# Patient Record
Sex: Male | Born: 1965 | Race: Black or African American | Hispanic: No | Marital: Single | Smoking: Current every day smoker
Health system: Southern US, Community
[De-identification: ages and names within clinical notes are randomized; demographics above are authoritative.]

## PROBLEM LIST (undated history)

## (undated) DIAGNOSIS — I2510 Atherosclerotic heart disease of native coronary artery without angina pectoris: Secondary | ICD-10-CM

## (undated) DIAGNOSIS — I639 Cerebral infarction, unspecified: Secondary | ICD-10-CM

## (undated) DIAGNOSIS — R569 Unspecified convulsions: Secondary | ICD-10-CM

## (undated) HISTORY — PX: CORONARY ANGIOPLASTY WITH STENT PLACEMENT: SHX49

---

## 2002-04-28 ENCOUNTER — Emergency Department (HOSPITAL_COMMUNITY): Admission: EM | Admit: 2002-04-28 | Discharge: 2002-04-28 | Payer: Self-pay | Admitting: Emergency Medicine

## 2002-04-28 ENCOUNTER — Encounter: Payer: Self-pay | Admitting: Emergency Medicine

## 2002-05-09 ENCOUNTER — Emergency Department (HOSPITAL_COMMUNITY): Admission: EM | Admit: 2002-05-09 | Discharge: 2002-05-10 | Payer: Self-pay | Admitting: Emergency Medicine

## 2002-05-10 ENCOUNTER — Encounter: Payer: Self-pay | Admitting: Emergency Medicine

## 2002-07-18 ENCOUNTER — Emergency Department (HOSPITAL_COMMUNITY): Admission: EM | Admit: 2002-07-18 | Discharge: 2002-07-19 | Payer: Self-pay | Admitting: Emergency Medicine

## 2002-07-18 ENCOUNTER — Encounter: Payer: Self-pay | Admitting: Emergency Medicine

## 2002-08-02 ENCOUNTER — Encounter: Admission: RE | Admit: 2002-08-02 | Discharge: 2002-08-02 | Payer: Self-pay | Admitting: Nephrology

## 2002-08-02 ENCOUNTER — Encounter: Payer: Self-pay | Admitting: Nephrology

## 2002-08-02 ENCOUNTER — Encounter: Payer: Self-pay | Admitting: Surgery

## 2002-08-02 ENCOUNTER — Encounter: Admission: RE | Admit: 2002-08-02 | Discharge: 2002-08-02 | Payer: Self-pay | Admitting: Surgery

## 2002-09-11 ENCOUNTER — Emergency Department (HOSPITAL_COMMUNITY): Admission: EM | Admit: 2002-09-11 | Discharge: 2002-09-12 | Payer: Self-pay | Admitting: Emergency Medicine

## 2002-09-12 ENCOUNTER — Encounter: Payer: Self-pay | Admitting: Emergency Medicine

## 2002-11-09 ENCOUNTER — Inpatient Hospital Stay (HOSPITAL_COMMUNITY): Admission: EM | Admit: 2002-11-09 | Discharge: 2002-11-12 | Payer: Self-pay | Admitting: Psychiatry

## 2004-11-18 ENCOUNTER — Emergency Department (HOSPITAL_COMMUNITY): Admission: EM | Admit: 2004-11-18 | Discharge: 2004-11-18 | Payer: Self-pay | Admitting: Emergency Medicine

## 2004-12-01 ENCOUNTER — Inpatient Hospital Stay (HOSPITAL_COMMUNITY): Admission: EM | Admit: 2004-12-01 | Discharge: 2004-12-02 | Payer: Self-pay | Admitting: Emergency Medicine

## 2005-01-21 ENCOUNTER — Emergency Department (HOSPITAL_COMMUNITY): Admission: EM | Admit: 2005-01-21 | Discharge: 2005-01-21 | Payer: Self-pay | Admitting: Emergency Medicine

## 2005-05-26 ENCOUNTER — Ambulatory Visit: Payer: Self-pay | Admitting: Psychiatry

## 2005-05-26 ENCOUNTER — Inpatient Hospital Stay (HOSPITAL_COMMUNITY): Admission: AD | Admit: 2005-05-26 | Discharge: 2005-06-01 | Payer: Self-pay | Admitting: Psychiatry

## 2006-02-17 ENCOUNTER — Emergency Department (HOSPITAL_COMMUNITY): Admission: EM | Admit: 2006-02-17 | Discharge: 2006-02-17 | Payer: Self-pay | Admitting: Emergency Medicine

## 2006-02-23 ENCOUNTER — Emergency Department (HOSPITAL_COMMUNITY): Admission: EM | Admit: 2006-02-23 | Discharge: 2006-02-23 | Payer: Self-pay | Admitting: Emergency Medicine

## 2006-03-11 ENCOUNTER — Inpatient Hospital Stay (HOSPITAL_COMMUNITY): Admission: EM | Admit: 2006-03-11 | Discharge: 2006-03-20 | Payer: Self-pay | Admitting: Emergency Medicine

## 2006-03-11 ENCOUNTER — Ambulatory Visit: Payer: Self-pay | Admitting: Infectious Diseases

## 2006-10-06 ENCOUNTER — Ambulatory Visit (HOSPITAL_COMMUNITY): Admission: RE | Admit: 2006-10-06 | Discharge: 2006-10-06 | Payer: Self-pay | Admitting: General Practice

## 2007-04-21 ENCOUNTER — Other Ambulatory Visit: Payer: Self-pay | Admitting: Emergency Medicine

## 2007-04-22 ENCOUNTER — Inpatient Hospital Stay (HOSPITAL_COMMUNITY): Admission: AD | Admit: 2007-04-22 | Discharge: 2007-04-25 | Payer: Self-pay | Admitting: Psychiatry

## 2007-04-24 ENCOUNTER — Ambulatory Visit: Payer: Self-pay | Admitting: Psychiatry

## 2007-11-03 ENCOUNTER — Emergency Department (HOSPITAL_COMMUNITY): Admission: EM | Admit: 2007-11-03 | Discharge: 2007-11-04 | Payer: Self-pay | Admitting: Emergency Medicine

## 2008-04-25 IMAGING — CR DG NASAL BONES 3+V
4 series · 4 of 4 positions shown · non-contrast
Comparison: none

CLINICAL DATA: Fell.  
 NASAL BONES ? 3 VIEW:

[w waters]
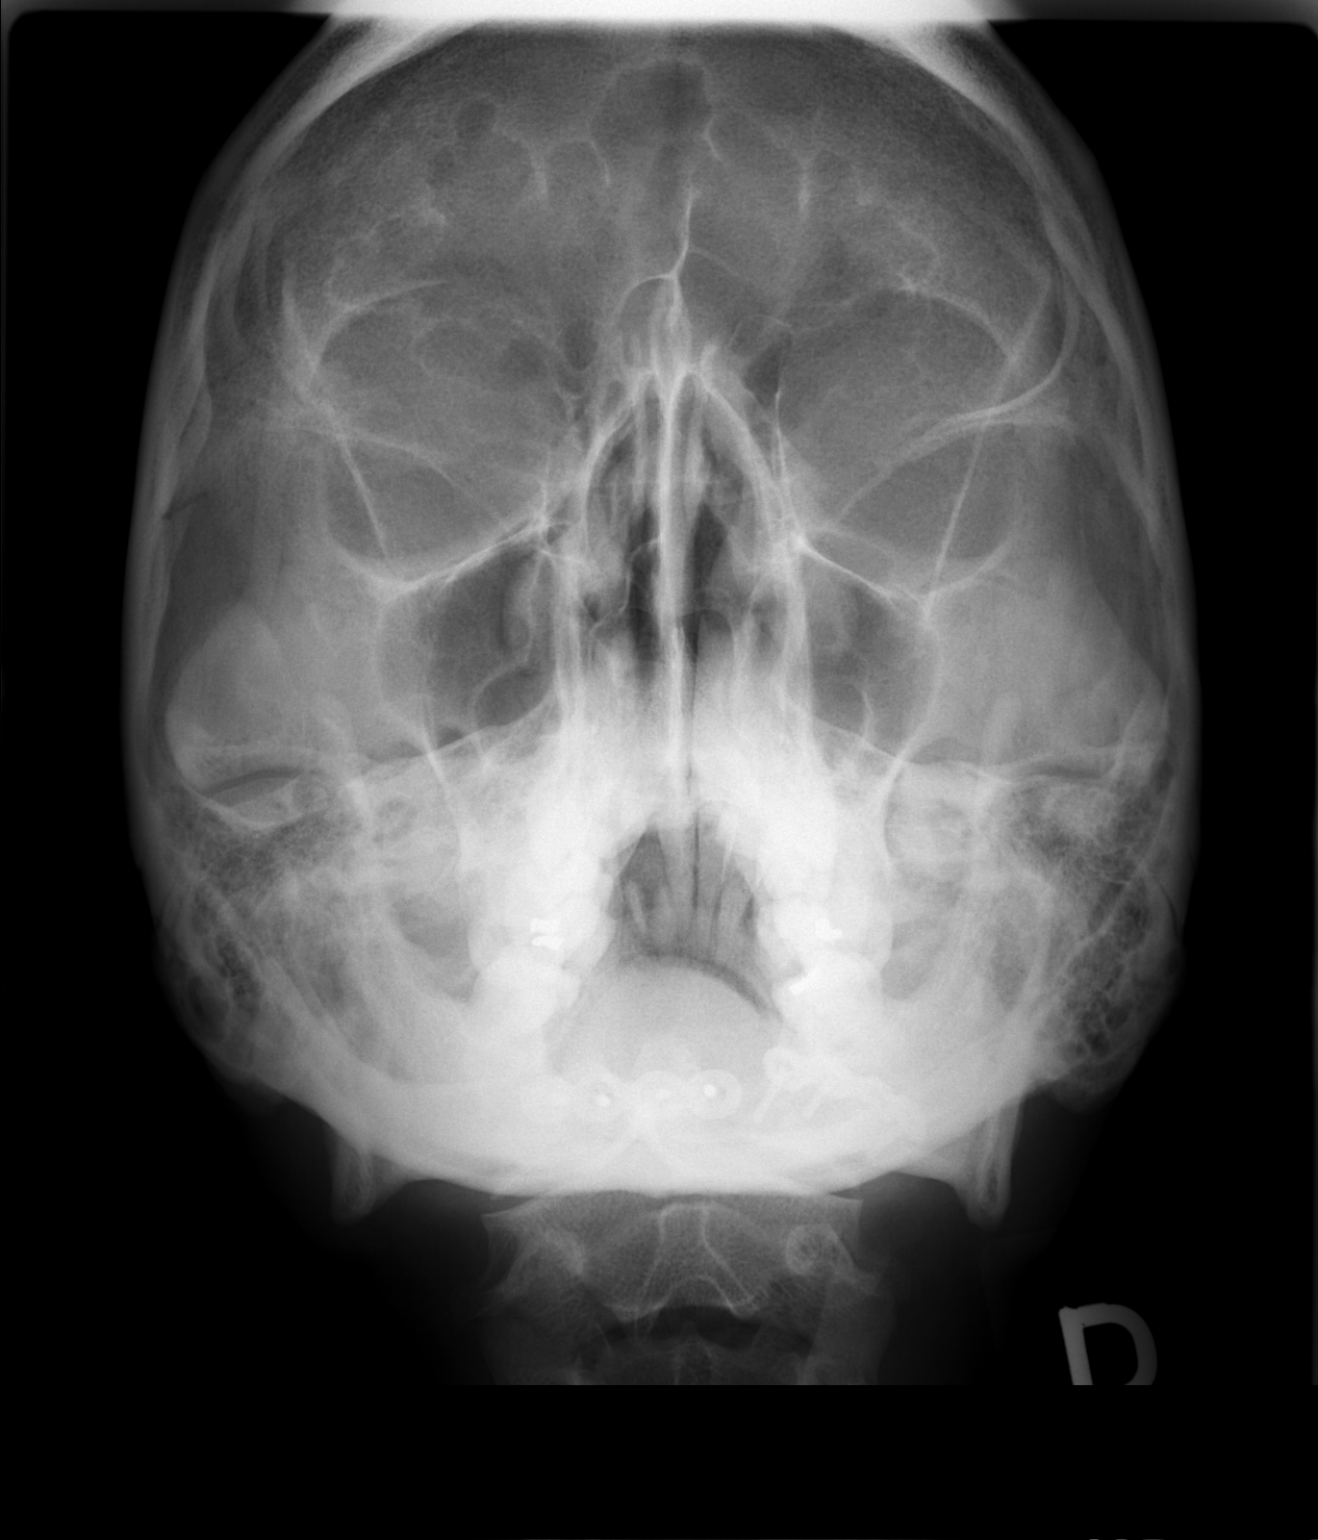

[w nasal bone lat * (1 of 3)]
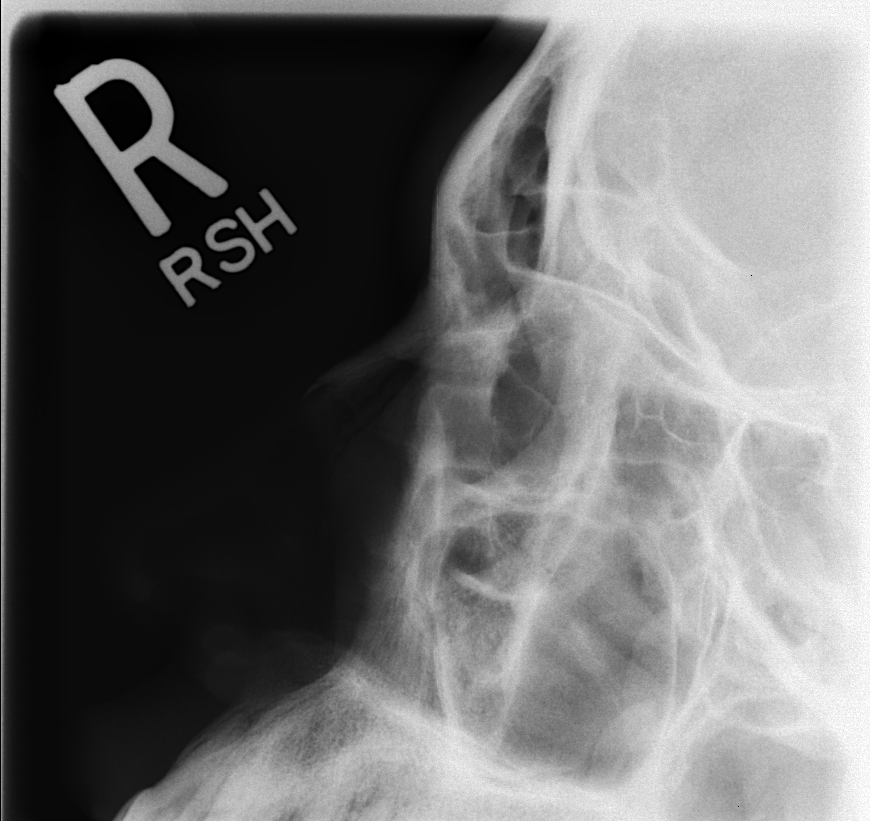

[w nasal bone lat * (2 of 3)]
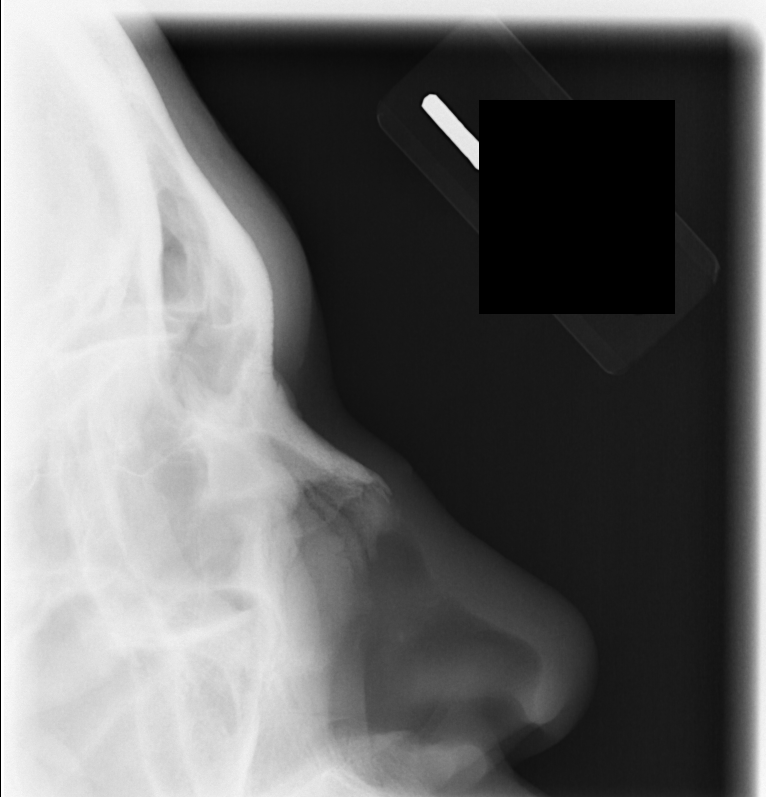

[w nasal bone lat * (3 of 3)]
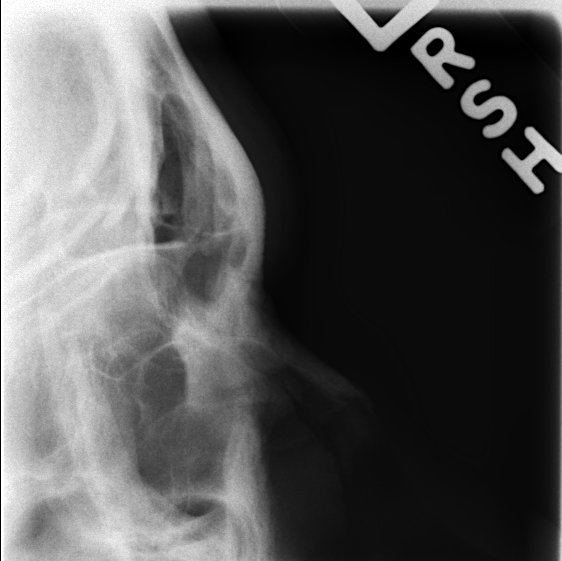

[4 of 4 positions shown; findings below may reference images not displayed]

FINDINGS: No definite nasal bone fractures are seen.  Visualized paranasal sinuses are clear.
IMPRESSION: No definite nasal bone fractures.

## 2008-05-17 IMAGING — CT CT ANGIO CHEST
2 of 4 series · 19 of 36 positions shown · IV contrast (APPLIED)
Comparison: Plain radiographs this same day.

CLINICAL DATA: Chest pain.
 CT ANGIOGRAPHY OF CHEST:
TECHNIQUE: Multidetector CT imaging of the chest was performed during bolus injection of intravenous contrast.  Multiplanar CT angiographic image reconstructions were generated to evaluate the vascular anatomy.
 Contrast:  100 cc Omnipaque 300.

[Series 5: pulm embolism 2.0 st · axial · 0.64mm/px · z∈[+1042,+1402]mm · 16 of 196 slices shown]
[im 8/196  lung]
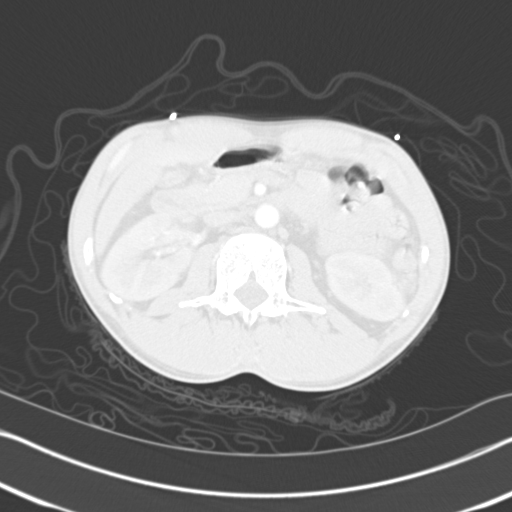
[im 24/196  mediastinal]
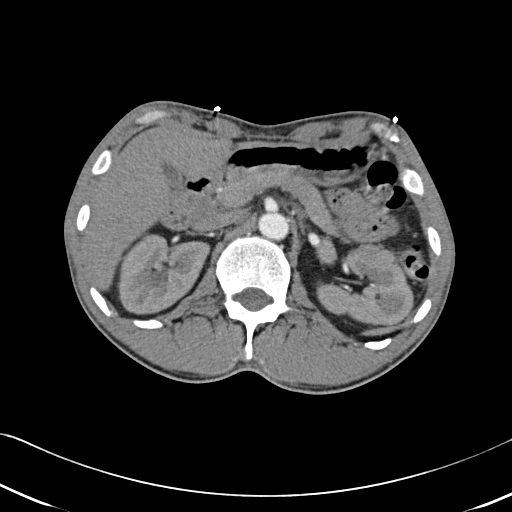
[im 32/196  lung]
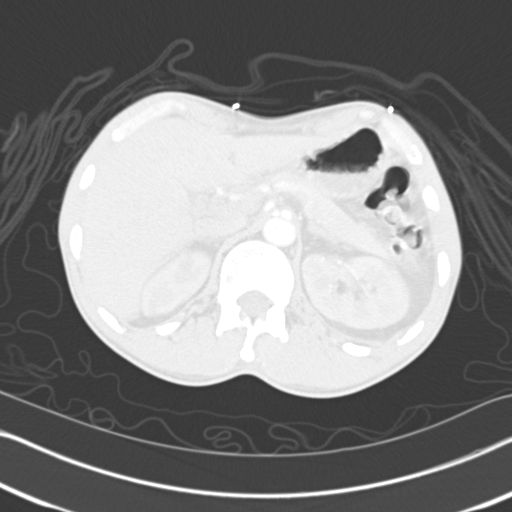
[im 47/196  mediastinal]
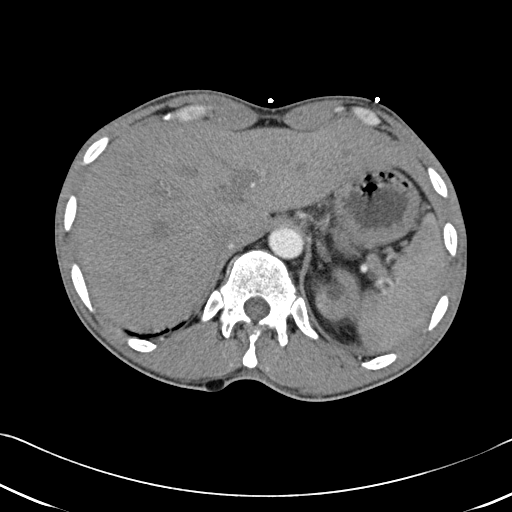
[im 55/196  lung]
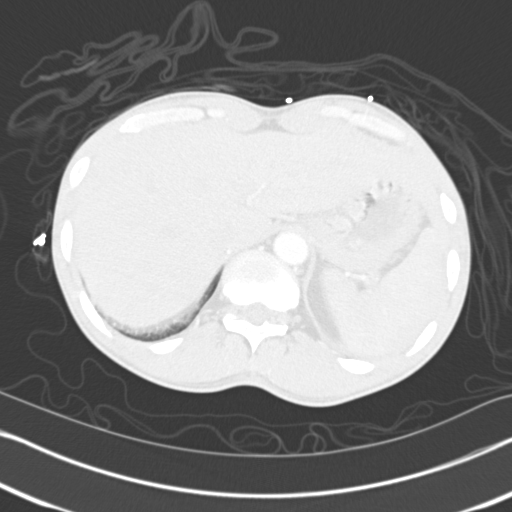
[im 71/196  mediastinal]
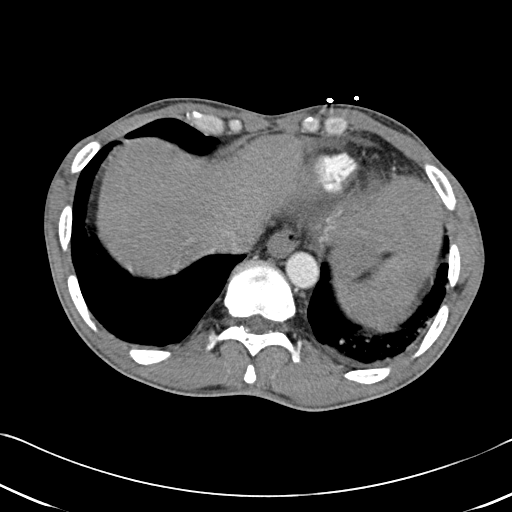
[im 79/196  lung]
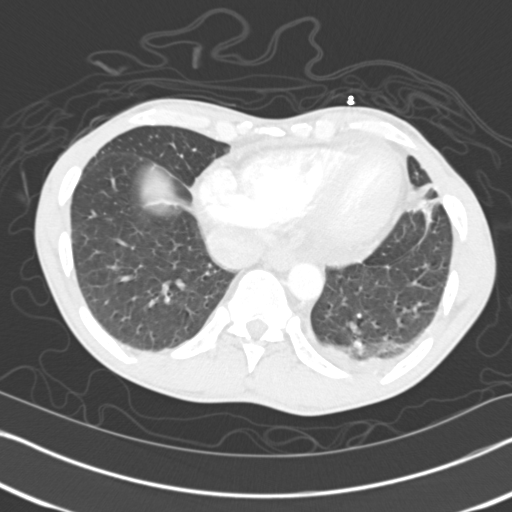
[im 94/196  mediastinal]
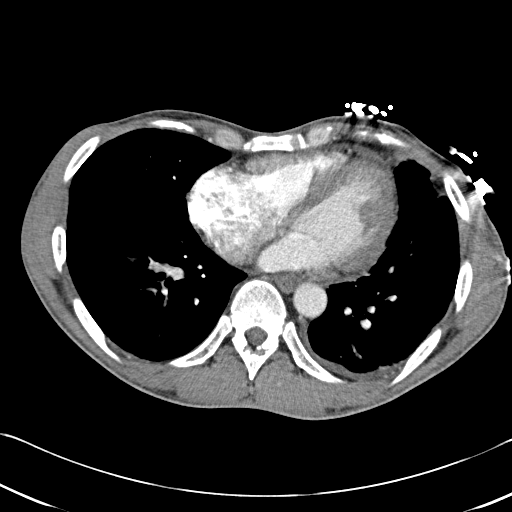
[im 102/196  lung]
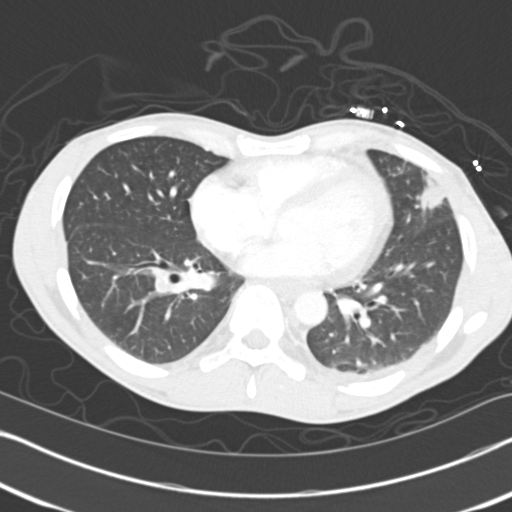
[im 118/196  mediastinal]
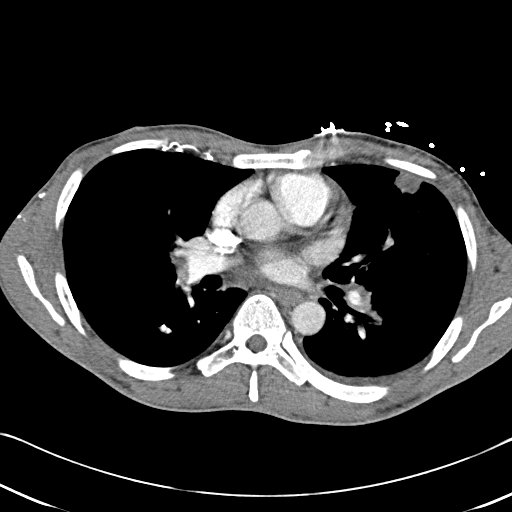
[im 125/196  lung]
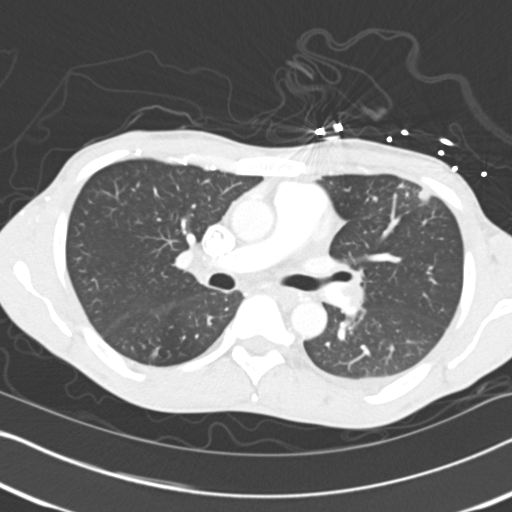
[im 141/196  mediastinal]
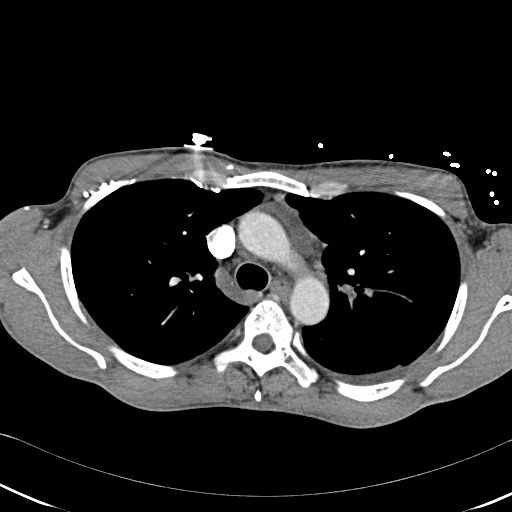
[im 149/196  lung]
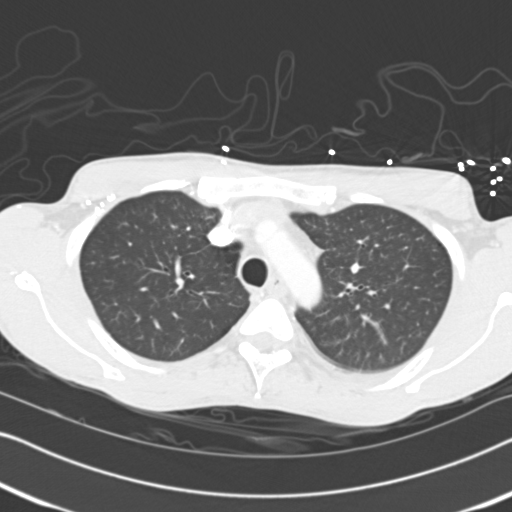
[im 164/196  mediastinal]
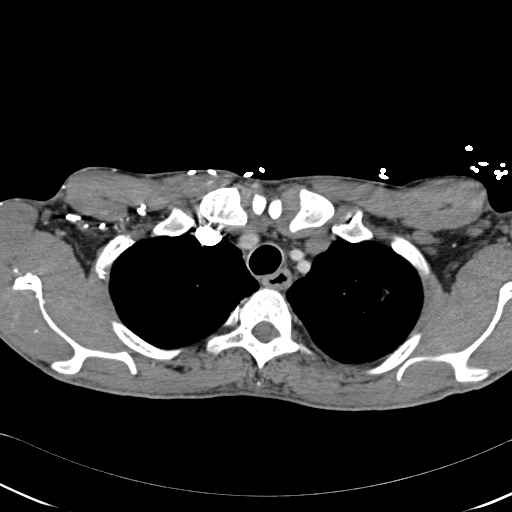
[im 172/196  lung]
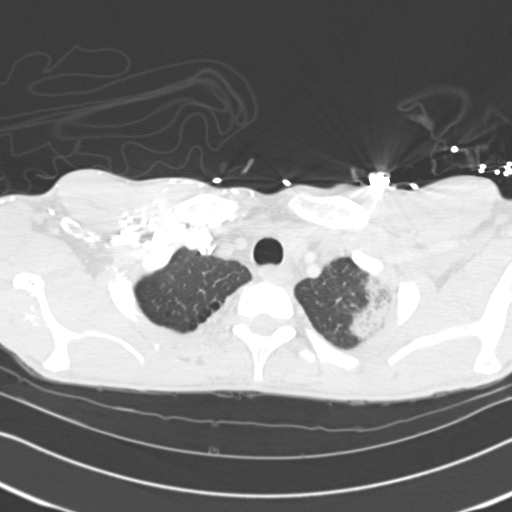
[im 188/196  mediastinal]
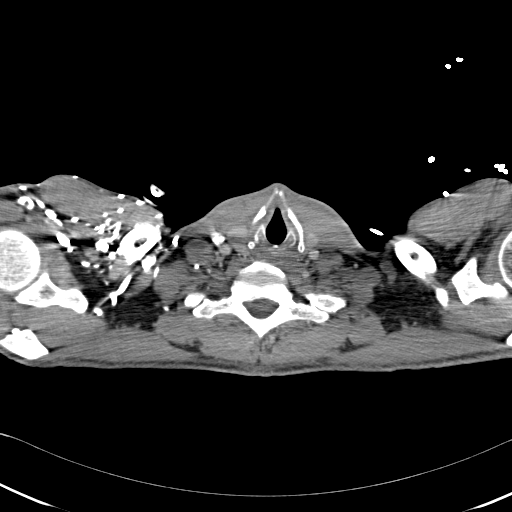

[Series 8: pulm embolism 2.0 cor · coronal · 0.76mm/px · 3 of 99 slices shown]
[im 20/99  mediastinal]
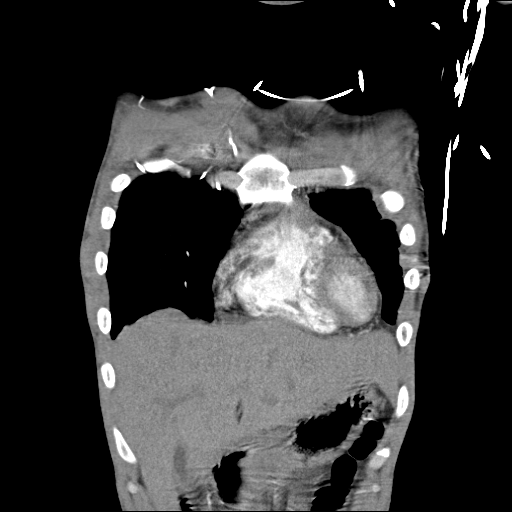
[im 40/99  mediastinal]
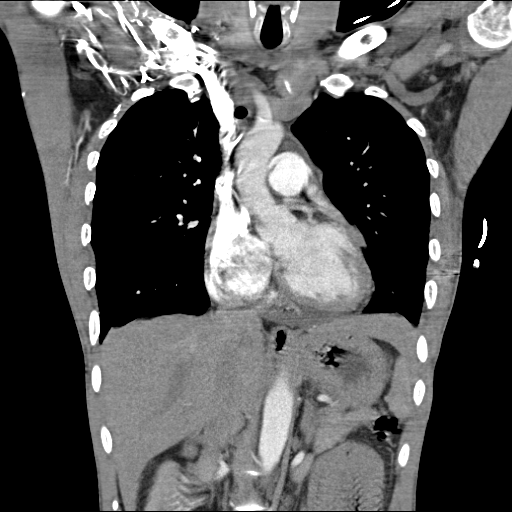
[im 59/99  mediastinal]
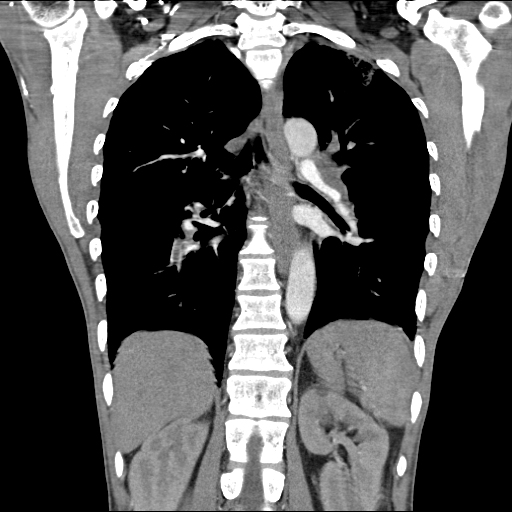

[19 of 36 positions shown; findings below may reference images not displayed]

FINDINGS: The patient has a large clot in the left main pulmonary artery with clot also identified in the right descending interlobar pulmonary artery.   The heart size is normal.   The patient has a small left pleural effusion.   There is a moderate pericardial effusion.   No hila, mediastinal, or axillary lymphadenopathy.   There is patchy airspace disease in the left upper lobe.   Densely calcified granuloma is seen in the superior segment of the right lower lobe.   There is also a pulmonary nodule measuring 2.0 x 2.0 cm in the left upper lobe.   In the lingula, a more triangular-shaped peripheral opacity is seen measuring 1.8 x 1.8 cm.   Lungs otherwise demonstrate some minimal dependent atelectatic change in the left base.   Incidentally imaged upper abdomen is unremarkable.   No focal bony abnormality.
IMPRESSION: 1.  This study is positive for pulmonary embolus with a fairly large clot burden as above. 
 2.  Nodular and peripheral opacities in the left lung as detailed above.  These may represent pulmonary infarcts, but neoplasm cannot be excluded, particularly in the left upper lobe lesion seen on image #77.  
 3.  Left apical airspace disease which may represent tuberculosis in this patient with a history of TB.
 4.  Small to moderate pericardial effusion.

## 2010-06-14 ENCOUNTER — Encounter: Payer: Self-pay | Admitting: Pulmonary Disease

## 2010-06-14 ENCOUNTER — Encounter: Payer: Self-pay | Admitting: General Practice

## 2010-09-10 ENCOUNTER — Emergency Department (HOSPITAL_COMMUNITY)
Admission: EM | Admit: 2010-09-10 | Discharge: 2010-09-10 | Disposition: A | Discharge status: Home / Self Care | Payer: Medicaid Other | Attending: Emergency Medicine | Admitting: Emergency Medicine

## 2010-09-10 DIAGNOSIS — M545 Low back pain, unspecified: Secondary | ICD-10-CM | POA: Insufficient documentation

## 2010-09-10 DIAGNOSIS — G40909 Epilepsy, unspecified, not intractable, without status epilepticus: Secondary | ICD-10-CM | POA: Insufficient documentation

## 2010-09-10 DIAGNOSIS — Z8679 Personal history of other diseases of the circulatory system: Secondary | ICD-10-CM | POA: Insufficient documentation

## 2010-10-06 NOTE — Discharge Summary (Signed)
**Note Patrick Marks** Patrick Marks, DUGO NO.:  [id number]   MEDICAL RECORD NO.:  [id number]          PATIENT TYPE:  IPS   LOCATION:  0406                          FACILITY:  BH   PHYSICIAN:  Anselm Jungling, MD  DATE OF BIRTH:  06/04/1965   DATE OF ADMISSION:  04/22/2007  DATE OF DISCHARGE:  04/25/2007                               DISCHARGE SUMMARY   IDENTIFYING DATA AND REASON FOR ADMISSION:  This was an inpatient  psychiatric admission for An[place], a 45 year old single African-American  male who was been living at the [facility].  He presented to the  emergency room at [facility] after calling 9-1-1.  He reported  that he had been smoking crack cocaine and began having suicidal  ideation.  Please refer to the admission note for further details  pertaining to the symptoms, circumstances and history that led to his  hospitalization.  He was given an initial Axis I diagnosis of cocaine  abuse, history of schizophrenia, and mood disorder.   MEDICAL AND LABORATORY:  The patient came to [place] with a history of  seizure disorder, and coagulopathy.  He was continued on Dilantin and  Coumadin.  These were followed closely by the nurse practitioner and the  pharmacist during his inpatient stay.  There were no acute medical  issues.   HOSPITAL COURSE:  The patient was admitted to the adult inpatient  psychiatric service.  He presented as a slender, but adequately  nourished African-American male who is generally pleasant and  cooperative.  He reported his mood being depressed, and his affect was  slightly flat.  His thought processes were clear, rational and goal  oriented, and there were no indications of psychosis.  He indicated  passive suicidal ideation.  He denied auditory or visual hallucinations.   He was involved in the therapeutic milieu and treated with a  psychotropic regimen that included Cymbalta 60 mg daily, Prozac 20 mg  daily, trazodone 100 mg at bedtime,  and, to address cocaine cravings,  amantadine 100 mg b.i.d..  He apparently had been treated with Haldol no  past, but there did not appear to be any compelling indication for him  to be on an antipsychotic at this time, therefore none was used.   The patient was a reasonably good participant in the treatment program.  By the fourth hospital day, eating and sleeping were stable, he appeared  absent any overt signs or symptoms of psychosis, and appeared  appropriate for discharge.   AFTERCARE:  The patient was to return to the [facility], and to  continue with his usual outpatient providers for psychiatric medication  and support.   DISCHARGE MEDICATIONS:  1. Cymbalta 60 mg daily.  2. Prozac 20 mg daily.  3. Trazodone 100 mg q.h.s.  4. Dilantin 300 mg, 3 tablets at bedtime.  5. Amantadine 100 mg b.i.d.  6. Coumadin 7.5 mg daily.   The patient was instructed to have PT/INR lab redrawn no later than  Monday, December 8.  He was instructed to follow up with his usual  medical Vincie Linn  regarding his anticoagulation therapy.  He was  instructed to not take Haldol any further.   DISCHARGE DIAGNOSES:  Axis I:  Cocaine dependence, early remission, and  history of substance-induced psychosis, mood disorder, not otherwise  specified.  Axis II:  Deferred.  Axis III:  History of seizure disorder, coagulopathy.  Axis IV:  Stressors severe.  Axis V:  Global Assessment of Functioning on discharge 55.      Anselm Jungling, MD  Electronically Signed     SPB/MEDQ  D:  04/25/2007  T:  04/25/2007  Job:  [id number] [id number]

## 2010-10-06 NOTE — H&P (Signed)
Patrick Marks NO.:  [id number]   MEDICAL RECORD NO.:  [id number]          PATIENT TYPE:  IPS   LOCATION:  0407                          FACILITY:  BH   PHYSICIAN:  Anselm Jungling, MD  DATE OF BIRTH:  1965-10-30   DATE OF ADMISSION:  04/22/2007  DATE OF DISCHARGE:                       PSYCHIATRIC ADMISSION ASSESSMENT   IDENTIFYING INFORMATION:  This is a 45 year old single African American  male.  He presented to the emergency room at [facility] last  night.  He had called 9-1-1.  He reported that he had been smoking on  crack; he had smoked 2 rocks and had begun having suicidal ideation.  Apparently he had called 9-1-1 and asked for help.  He states that he  has depression and crack abuse.  He is unable to kick using crack; he  states he wants to overdose on crack and he states that he has been in  rehab and it does not help.  He states he has had several attempts  before and cannot contract for safety.  He requested detox and he states  that drink helps to increase chances of suicide.   PAST PSYCHIATRIC HISTORY:  He has only has 1 other inpatient stay in  2002 at [facility]; he was an inpatient for suicidal  ideation and substance abuse.   SOCIAL HISTORY:  He states he went to the tenth grade.  He lived in a  group home since age 12.   FAMILY HISTORY:  He denies.   ALCOHOL AND DRUG HISTORY:  He began using alcohol around age 68.  He  drinks 1 40 ounce beer a week and crack he began using at age 72.  He  uses $ 20.00 bag daily he says.  His primary care Jennings Stirling is a Dr.  Estrella Myrtle main office in [place],  but he comes to the home and he is  followed by [facility], Dr. Hortencia Pilar.   MEDICAL PROBLEMS:  He is known to have a seizure disorder.  He is status  post a pulmonary embolus and treated with Coumadin.  He is also status  post a CVA in 2002.   MEDICATIONS:  He is currently prescribed Prozac 20  mg p.o. daily,  trazodone 100 mg at bedtime, Dilantin 300 mg at bedtime, warfarin 7.5 mg  p.o. daily, his next Prolixin level is due a week from Tuesday and he is  followed at the [facility] for that.   ALLERGIES:  PENICILLIN GIVES HIM A RASH.   POSITIVE PHYSICAL FINDINGS:  He was medically cleared in the ED at the  hospital last night and his UDS was positive for cocaine.  He had no  alcohol on board.  His labs showed that his SGOT was slightly elevated  at 50 and he had no other worrisome laboratory findings.   PHYSICAL EXAMINATION:  VITAL SIGNS:  His vital signs on admission to our  unit show that he is 72 inches tall.  He weighs 134, temperature is  96.9, blood pressure was 127/80 and his pulse was 59,  respirations are  20.  He cannot remember how long it has been he has actually had a  seizure.  EXTREMITIES:  He has partial contracting of his right hand.  This is  felt to be due to a CVA back in 2002.  SKIN:  He has several what appear to be benign cysts, little lumps.  Please see anatomic drawing to look at this.  The remainder of his physical exam was unremarkable.   MENTAL STATUS EXAM:  He is alert and oriented times 3.  He is  appropriately groomed, dressed and nourished.  His speech has an  appropriate rate, rhythm and tone.  His mood he reports being depressed.  His affect is slightly flat.  His thought processes are clear, rational  and goal oriented.  He wants to get off the crack.  Judgment and insight  are fair.  Concentration and memory are fair.  Intelligence is average.  He is passively suicidal.  He is not sure he would be safe if he was  discharged at this point in time.  He denies being homicidal.  He denies  auditory or visual hallucinations.   DIAGNOSES:  AXIS I:  Diagnosis is cocaine abuse versus dependence,  schizophrenia, depressive disorder not otherwise specified.  AXIS II:  Deferred.  AXIS III:  Seizure disorder.  Pulmonary embolism  treated with Coumadin  and status post cerebrovascular accident in 2002.  AXIS IV:  Burden of illness.  AXIS V:  30.   PLAN:  The plan is to admit for safety and stabilization.  We will  adjust his medication.  Toward that end, we added some Cymbalta to help  with his depression and anxiety.  His estimated length of stay is 3 to 4  days.  He will resume treatment at the [facility]  under the direction of Dr. Hortencia Pilar.      Mickie Leonarda Salon, P.A.-C.      Anselm Jungling, MD  Electronically Signed    MD/MEDQ  D:  04/22/2007  T:  04/22/2007  Job:  [id number]

## 2010-10-09 NOTE — Discharge Summary (Signed)
Patrick Marks, Patrick Marks NO.:  [id number]   MEDICAL RECORD NO.:  [id number]                   PATIENT TYPE:  IPS   LOCATION:  0306                                 FACILITY:  BH   PHYSICIAN:  Jeanice Lim, M.D.              DATE OF BIRTH:  [birth year]   DATE OF ADMISSION:  11/09/2002  DATE OF DISCHARGE:  11/12/2002                                 DISCHARGE SUMMARY   IDENTIFYING DATA:  A 45 year old African-American male, single, voluntarily  admitted with a history of cocaine abuse, schizophrenia, borderline, and  relapsing on cocaine, having thoughts of killing himself, ashamed of cocaine  use.   MEDICATIONS:  1. Risperdal IM, last dose on November 08, 2002.  2. Ativan 0.5 mg daily.  3. Prozac daily.  4. Dilantin 100 mg b.i.d. and 200 mg q.h.s.   DRUG ALLERGIES:  To PENICILLIN.   PHYSICAL EXAMINATION:  GENERAL:  Essentially within normal limits.  NEUROLOGICAL:  Nonfocal.   LABORATORY DATA:  Routine admission labs essentially within normal limits.   MENTAL STATUS EXAM:  A tall, slim, alert male, sociable, appropriate.  Speech within normal limits; no pressure.  Mood euthymic.  Affect full.  Thought process goal directed.  Thought content appropriate; no acute  dangerous ideation or psychotic symptoms.  Cognitively intact.  Judgment and  insight fair.   ADMISSION DIAGNOSES:   AXIS I:  1. Schizophrenia, not otherwise specified.  2. Cocaine abuse.   AXIS II:  Deferred.   AXIS III:  History of a seizure disorder.   AXIS IV:  Moderate stress related to group home and other psychosocial  issues.   AXIS V:  30/55.   HOSPITAL COURSE:  Patient was admitted, ordered routine p.r.n. medications,  underwent further monitoring, was encouraged to participate in the  individual, group, and milieu therapy.  Patient had right-sided weakness  from a stroke three years ago and a history of a seizure disorder.  He was  monitored on seizure precautions,  and patient claimed mild withdrawal  symptoms, reported improving mood as he was stabilized.   CONDITION AT DISCHARGE:  His condition at discharge was markedly improved.  Mood was more euthymic, affect brighter, thought process goal directed,  thought content negative for dangerous ideation or psychotic symptoms, there  were no suicidal thoughts, and the patient reported motivation to be  compliant with the after-care plan.   DISCHARGE MEDICATIONS:  1. Risperdal 0.25 mg IM every 14 days.  2. Dilantin 100 mg two q.h.s.  3. Ativan 0.5 mg t.i.d.  4. Prevacid 20 mg q.a.m.  5. Trazodone 100 mg q.h.s.   DISCHARGE DIAGNOSES:   AXIS I:  1. Schizophrenia, not otherwise specified.  2. Cocaine abuse.   AXIS II:  Deferred.   AXIS III:  History of a seizure disorder.   AXIS IV:  Moderate stress related to group home and other psychosocial  issues.  AXIS V:  Global assessment of functioning on discharge was 55.   FOLLOW UP:  The patient is to follow up with [facility] on November 15, 2002, at 2:30 p.m.                                               Jeanice Lim, M.D.    JEM/MEDQ  D:  12/05/2002  T:  12/06/2002  Job:  [id number]
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
## 2010-10-09 NOTE — H&P (Signed)
NAMEDYON, ROTERT NO.:  [id number]   MEDICAL RECORD NO.:  [id number]                   PATIENT TYPE:  IPS   LOCATION:  0306                                 FACILITY:  BH   PHYSICIAN:  Jeanice Lim, M.D.              DATE OF BIRTH:  [birth year]   DATE OF ADMISSION:  11/09/2002  DATE OF DISCHARGE:  11/12/2002                         PSYCHIATRIC ADMISSION ASSESSMENT   IDENTIFYING INFORMATION:  This is a 45 year old African-American male who is  single.  This is a voluntary admission.   HISTORY OF PRESENT ILLNESS:  This patient, with a history of cocaine abuse,  schizophrenia and borderline intellect, apparently relapsed on cocaine and  was having thoughts of killing himself, being ashamed of his cocaine use.  He also reported that he had auditory hallucinations, during this past week,  but denies that he has had any today.  He has a past history of cocaine  abuse and some alcohol abuse in the past.  The patient lives in an assisted-  living family home and apparently had not developed any specific plan for  suicide.  He was feeling ashamed of himself for using cocaine, knew that it  was wrong and apparently had nephews and friends in the assisted-living  facility that had encouraged him to use the cocaine along with some of the  other residents.   PAST PSYCHIATRIC HISTORY:  The patient is followed at [facility].  This is his first admission to [facility].  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  He has a  history of admissions to [facility] in November of 2003 and that is  his only other admission.   SOCIAL HISTORY:  The patient is single.  He is originally from [place] and  came here in 2003 to be near his sister and her family.  He currently is  living in [facility] since October of 2003.  He has a history  of legal charges for panhandling for  cocaine money in the past but no  current charges.   FAMILY HISTORY:  Not available.   ALCOHOL/DRUG HISTORY:  The patient is currently abusing cocaine.  He denies  any other substance abuse.   PAST MEDICAL HISTORY:  The patient's primary care Tanairi Cypert for [facility] is not clear.  The patient has history of seizures.  He  denies any other medical problems.  He does normally drink Boost drink as a  nutritional supplement when he is at home.   MEDICATIONS:  Risperdal Consta IM, last dose 25 mg on November 08, 2002, Ativan  0.5 mg p.o. t.i.d., Prozac 20 mg q.d., Dilantin 100 mg b.i.d. and 20 mg  q.h.s.   ALLERGIES:  PENICILLIN.   POSITIVE PHYSICAL FINDINGS:  The patient physical examination was done in  the emergency room and is noted in the  record.  It was generally  unremarkable.  Today, we note this is a tall, slim, fully alert male with a  bright affect.   LABORATORY DATA:  Very slight mild anemia with hemoglobin of 11.5 on a  normal scale of 13-17, hematocrit 34.7 on a scale of 39-52, RDW 15.1,  platelets 194,000, MCV 80.5.  Other measures are normal.  His white blood  cell count is 5600.  The patient's electrolytes were normal as were his  liver enzymes.  Thyroid panel is currently pending as are his urinalysis and  urine drug screen.  His Dilantin level is 13.6 on a scale of 10-20 as  normal.  Alcohol level was less than 5.  Vital signs include temperature 97,  pulse 66, respirations 18, blood pressure 108/70.  He is 6 feet 3-1/2 inches  tall and weighs 140 pounds.  BMI 18.   MENTAL STATUS EXAM:  This is a tall, slim male who is fully alert with quite  a bright affect.  He is sociable and he is generally appropriate,  directible, emotionally available.  Quite interested in the interview.  Speech is within normal limits.  No evidence of pressure.  His mood is  euthymic.  It is clear that he has some considerable guilt about using the  cocaine.  Felt somewhat  hopeless and ashamed of himself but currently is  denying any overt suicidality.  Thought process is fairly logical.  He is  goal directed in his thoughts.  Wants to attend groups and do well.  No  evidence of hallucinations or other psychosis.  Cognitively, he is intact  and oriented x 3.  The patient asked pertinent and appropriate questions  throughout the conversation.  He is concerned about wanting to live  independently but his history does not really support his ability to do so.  He has got a lot of questions about that today and wants to know if he can  get some help to move out on his own.   DIAGNOSES:   AXIS I:  1. Depression not otherwise specified.  2. Schizophrenia not otherwise specified by history.  3. Cocaine abuse.   AXIS II:  Deferred.   AXIS III:  Seizure disorder not otherwise specified.   AXIS IV:  Moderate (stress at the group home).   AXIS V:  Current 39; past year 28.   PLAN:  Voluntarily admit the patient with 15-minute checks in place.  We are  going to continue his current medications and evaluate his mood and  thinking.  We are going to place him on dual-diagnosis programming and have  provided him with Librium 25 mg q.6h. p.r.n. for withdrawal symptoms and the  Haldol 5 mg IM p.o. q.4h. p.r.n. agitation.  Will continue his routine dose  of Risperdal as needed.  We are going to ask the casemanager to evaluate his  family support and the situation out there at [facility] and  we will see how he progresses.   ESTIMATED LENGTH OF STAY:  Five days.     Margaret A. Stephannie Peters                   Jeanice Lim, M.D.    MAS/MEDQ  D:  12/03/2002  T:  12/03/2002  Job:  [id number]

## 2010-10-09 NOTE — Discharge Summary (Signed)
Patrick Marks, ALEGRIA NO.:  [id number]   MEDICAL RECORD NO.:  [id number]          PATIENT TYPE:  INP   LOCATION:  3714                         FACILITY:  [facility]   PHYSICIAN:  Jackie Plum, M.D.DATE OF BIRTH:  14-Jun-1965   DATE OF ADMISSION:  03/11/2006  DATE OF DISCHARGE:                                 DISCHARGE SUMMARY   Anticipated date of discharge is pending, possibly tomorrow.   DISCHARGE DIAGNOSES:  1. Pulmonary embolism.      a.     CT scan angiogram done on March 11, 2006 indicated a pulmonary       embolism with very large clot burden. The patient was started on       Coumadin anticoagulation, INR 2.2 today.  2. History of tuberculosis.  3. History of schizophrenia.  4. History of seizure disorder.  5. History of allergy to penicillin.  6. History of cigarette smoking.  7. History of psychotic disorder, not otherwise specified (the patient is      to follow up at [facility] on discharge).   CONSULTANTS:  Infectious disease and psychiatry.   PROCEDURE:  Not applicable.   CONDITION ON DISCHARGE:  Final condition on discharge will be dictated at  time of final discharge from the hospital.   DISCHARGE MEDICATIONS:  Final medications at the time of discharge will be  dictated at the time of discharge by discharging physician.   REASON FOR ADMISSION:  Pulmonary embolism. The patient presented with chest  pain. On presentation, x-ray of the chest was done which showed emphysema  without acute disease. CT scan angiography ruled in for pulmonary embolism,  and therefore, the patient was admitted for further evaluation and treatment  in this regard. For full details regarding patient's presenting signs,  symptoms and lab work at time of admission, please review the H&P dictated  by Dr. Suanne Marker on March 11, 2006.   On admission, the patient was placed on a telemetry bed.  There were no  significant arrhythmias. He was  started on anticoagulation with monitoring  of his hematologic indices. The patient continued to remain fairly stable  throughout the stay. On account of his history of TB and findings on CT  scan, infectious disease was consulted __________ as to whether to continue  him on antituberculosis medication regimen. However, after further  evaluation of sputum specimen __________ and after discussions with the  health department, it was found out that patient had nearly completed his  INH. It was also of much concern regarding further INH an increase in his  INR, being started on anticoagulation it was felt that there was no need for  further treatment since he had almost completed his medication regimen. The  patient was told not to be actively infected with tuberculosis and his  isolation status was discontinued. He has continued to do well, and he is  awaiting discharge from hospital pending adequate anticoagulation.   On rounds today, the patient denies any fevers or chills, shortness of  breath. No chest pain.   His vital signs were stable. His  BP was 108/70, pulse 77, temperature 96.9  degrees Fahrenheit, O2 __________ % on room air.  His cardiopulmonary  auscultation reveals diminished breath sounds __________ likely improved  __________.  __________ no gallops or murmur.  Abdomen was soft, nontender,  bowel sounds present. Extremities:  No cyanosis.   His lab work indicated PT of 25.6, INR of 2.2. The patient's last chemistry  indicated sodium of 135, potassium 4.0, chloride 106, CO2 23, glucose 81,  BUN 12, creatinine 0.7, calcium 9.2. His last WBC count was 5.4, hemoglobin  11.6, hematocrit 34.8, MCV 82.5, platelet count 263.   This patient will be discharged tomorrow should his INR be therapeutic. At  the time of discharge, his final discharge medications will be dictated as  an addendum by the discharging physician __________ tomorrow.      Jackie Plum, M.D.   Electronically Signed     GO/MEDQ  D:  03/18/2006  T:  03/19/2006  Job:  [id number]

## 2010-10-09 NOTE — Discharge Summary (Signed)
NAMEGAREN, Patrick Marks               ACCOUNT NO.:  [id number]   MEDICAL RECORD NO.:  [id number]          PATIENT TYPE:  INP   LOCATION:  3714                         FACILITY:  [facility]   PHYSICIAN:  Kela Millin, M.D.DATE OF BIRTH:  July 29, 1965   DATE OF ADMISSION:  03/11/2006  DATE OF DISCHARGE:  03/20/2006                                 DISCHARGE SUMMARY   ADDENDUM:  This is an addendum to the discharge summary dictated on March 18, 2006, by Dr. Julio Sicks.  This is just to indicate the discharge  medications.   DISCHARGE MEDICATIONS:  1. Coumadin 7.5 mg p.o. q.h.s. (6 p.m.), the patient's PT/INR is to be      checked on March 22, 2006, and the results called to Dr. Petra Kuba      for the adjustment of the Coumadin dose as appropriate to keep his INR      between 2 and 3 for his pulmonary embolus.  2. Prozac 20 mg p.o. daily.  3. Dilantin 300 mg p.o. q.h.s.  4. Trazodone 100 mg p.o. q.h.s.  5. Prolixin 0.5 mL injection, specialized dosing, to be continued as prior      to admission.   Infectious disease recommended that the patient have serial CT scans of his  chest with contrast to follow up on the lung nodular lesions/opacities, and  the patient is to be scheduled for the first follow-up CT scan in 3 months  and as a result of this, to be called to Dr. Petra Kuba, and he is to  schedule the subsequent serial CT scans.  As indicated in the discharge  summary by Dr. Julio Sicks on October 26, the INH along with vitamin B6 were  discontinued by infectious disease while the patient was in the hospital.   FOLLOW-UP CARE:  The patient is to be scheduled for a follow-up appointment  with Dr. Petra Kuba in one week.   DISCHARGE CONDITION:  Improved/stable.  See the discharge summary of March 18, 2006, for full details of the patient's hospital stay.   ENCLOSURE:  Discharge summary of March 18, 2006, by Dr. Julio Sicks to  Mina Marble, M.D.      Kela Millin, M.D.  Electronically Signed     ACV/MEDQ  D:  03/20/2006  T:  03/20/2006  Job:  [id number]   cc:   Mina Marble, M.D.

## 2010-10-09 NOTE — Consult Note (Signed)
NAMELUDWIG, TUGWELL NO.:  [id number]   MEDICAL RECORD NO.:  [id number]          PATIENT TYPE:  INP   LOCATION:  3702                         FACILITY:  [facility]   PHYSICIAN:  Antonietta Breach, M.D.  DATE OF BIRTH:  1966/02/07   DATE OF CONSULTATION:  DATE OF DISCHARGE:                                   CONSULTATION   REASON FOR CONSULTATION:  Treatment of psychotic condition.   HISTORY OF PRESENT ILLNESS:  Patrick Marks is a 45 year old male admitted to  the [facility] on the 19th of October, 2007, due to chest  pain.  He has a diagnosis of schizophrenia and resides in a group home.  He  was diagnosed with a pulmonary embolism and a left upper lobe lesion was  also found.  The patient has been cooperative with care.  He does not have  any depressed mood or suicidal ideation.  He has no homicidal ideation.  He  is not currently having any auditory or visual hallucinations.  He has been  receiving Prolixin maintenance.   PAST PSYCHIATRIC HISTORY:  The patient has undergone psychiatric admission.  His most recent was in January of 2007 and before that, June of 2004.  He is  followed by the [facility].  He does have a  history of delusions, and he has had a history of suicidal ideation without  suicide attempts.  He also has a history of hallucinations.  His diagnosis  has been schizophrenia.  The patient has been treated with Prolixin  decanoate, and he receives this every 2 weeks at 25 mg intramuscular.  He  also has been treated with Risperdal in the past.   The patient does have a history of noncompliance with his treatment.   FAMILY PSYCHIATRIC HISTORY:  None known.   SOCIAL HISTORY:  The patient is single.  He has no children.  He has an 8th  grade education.  Occupationally, he is disabled.  He was originally born in  [place].  He resides in a group home.   Alcohol:  None.  The patient has used cocaine in the  past.   GENERAL MEDICAL PROBLEMS:  There is a history of TB, seizure disorder,  emphysema.  He has a history of cerebrovascular disease, with a CVA in 2002.   MEDICATIONS:  The MAR is reviewed.  The patient is on tuberculosis  medication.  He is treated with:  1. Trazodone 100 mg q.h.s.  2. Prozac 20 mg every day.  3. Prolixin 25 mg every 2 weeks intramuscular, decanoate.   ALLERGIES:  THE PATIENT IS ALLERGIC TO PENICILLIN.   LABORATORY DATA:  INR 1.2, CK MB 1.0.   REVIEW OF SYSTEMS:  CONSTITUTIONAL:  Afebrile.  HEAD:  No trauma.  EYES:  No  visual changes.  EARS:  No hearing impairment.  NOSE:  No rhinorrhea.  MOUTH/THROAT:  No sore throat.  NEUROLOGIC:  As above.  PSYCHIATRIC:  As  above.  CARDIOVASCULAR:  Mild chest pain.  RESPIRATORY:  No coughing or  wheezing.  GASTROINTESTINAL:  No nausea, vomiting,  or diarrhea.  GENITOURINARY:  No dysuria.  SKIN:  Unremarkable.  MUSCULOSKELETAL:  No  deformities.  HEMATOLOGIC/LYMPHATIC:  Unremarkable.  ENDOCRINE/METABOLIC:  Unremarkable.   EXAMINATION:  VITAL SIGNS:  Temperature 96.9, pulse 93, respiration 18,  blood pressure 88/52.  The O2 saturation on room air is 93%.   MENTAL STATUS EXAM:  Patrick Marks is alert.  He has appropriate eye contact.  His speech involves normal rate and prosody.  He does not have any psych or  motor agitation.  He is oriented to the year, the month, to the day, person  and place, as well as circumstances.  He has a disheveled appearance.  Mood:  I am okay.  Affect:  Appropriate.  Thought process:  Logical, coherent,  goal directed.  No looseness of association.  Thought content:  No thoughts  of harming himself.  No thoughts of harming others.  No delusions.  No  hallucinations.  Memory:  3/3 immediate, 3/3 on recall.  Remote memory is  intact.  Insight is fair.  Judgment is intact.  Fund of knowledge and  intelligence:  Less than average.  The patient exhibits no involuntary  abnormal movements or  stiffness.   ASSESSMENT:   AXIS I:  293.81  Psychotic disorder not otherwise specified.  Rule out  schizophrenia, undifferentiated time, stable.  History of cocaine abuse.   AXIS II:  None.   AXIS III:  See general medical problems.   AXIS IV:  General medical.   AXIS V:  55.   Patrick Marks is not at risk to harm himself or others.   He agrees to use emergency services for any psychiatric emergency symptoms.   He does have a history of maintenance with Prolixin, and his psychotic  symptoms are in remission.  He does not appear to be having any side  effects; therefore, with his history of noncompliance, would recommend  continuing the Prolixin.   Would give his next Prolixin decanoate shot at 25 mg on the 30th of October.  Would also prescribe Prolixin 5 mg IM b.i.d. p.r.n. agitation.  Would also,  for antidepression maintenance, start Prozac 20 mg every day.   In case of extrapyramidal symptoms, would order Cogentin 2 mg IM b.i.d.  p.r.n.   RECOMMENDATIONS:  Regarding discharge planning, it is anticipated that he  will follow up with the [facility] and continue  his maintenance of psychiatric medication as above.     Antonietta Breach, M.D.  Electronically Signed    JW/MEDQ  D:  03/13/2006  T:  03/13/2006  Job:  [id number]

## 2010-10-09 NOTE — Discharge Summary (Signed)
NAMETODDRICK, SANNA NO.:  [id number]   MEDICAL RECORD NO.:  [id number]          PATIENT TYPE:  IPS   LOCATION:  0402                          FACILITY:  [facility]   PHYSICIAN:  Anselm Jungling, MD  DATE OF BIRTH:  27-Sep-1965   DATE OF ADMISSION:  05/26/2005  DATE OF DISCHARGE:  06/01/2005                                 DISCHARGE SUMMARY   IDENTIFYING DATA AND REASON FOR ADMISSION:  The patient is a 45 year old  single African-American male with schizoaffective disorder, seizures and  cocaine abuse, currently living in a group home, admitted on involuntary  petition secondary to increasing depression and suicidal ideation. Although  he has had psychotic symptoms in the past consisting of auditory  hallucinations, he denied auditory hallucinations at the time of admission.  He indicated that he was not currently taking his medication. Past  medication regimen had included Prolixin D injection q. 2 weeks and  Dilantin. The nature of his seizure disorder history was unclear at the time  of admission. This was not his first [facility] admission. Please refer to the  admission note for further details pertaining to the symptoms, circumstances  and history that led to his hospitalization. He was given an initial Axis I  diagnosis of schizoaffective disorder, depressed, and history of cocaine  abuse.   MEDICAL AND LABORATORY:  The patient was medically assessed by the  psychiatric nurse practitioner. There were no significant medical issues  during this brief inpatient psychiatric stay. Dilantin level was measured  and felt to be acceptable.   HOSPITAL COURSE:  The patient was admitted to the adult inpatient  psychiatric service. He was restarted on Prolixin 10 mg b.i.d., orally. This  was well tolerated. He was also continued on Dilantin 300 mg q.h.s.. Seizure  precautions were in place.   He presented as a tall, slender male who is pleasant and friendly, but very  quiet,  reserved, and soft spoken. He generally showed smiling, pleasant  affect, and tended to avoid verbalizing any specific complaints. He did  indicate on admission that he felt somewhat depressed. Otherwise, he seemed  to have few specific complaints during his hospital stay, usually responding  that he felt fine.  He was somewhat isolative, tended to spend a lot of  time in bed, which was partly interpreted as being deficient on sleep at the  time of his admission.   By the seventh hospital day, it was apparent that the patient was feeling  relatively well, indicating that he felt improved, denying any psychotic  symptoms, and probably at baseline level of functioning. At this time he was  felt appropriate for discharge.   AFTERCARE:  The patient was to be seen at [facility] on  June 03, 2005 for medication follow-up. The patient was given an  injection of Prolixin D 25 mg IM on May 29, 2005,  and his next injection  was due June 12, 2005. He was discontinued from oral Prolixin prior to  discharge. Dilantin 300 mg q.h.s. was continued upon discharge.   DISCHARGE DIAGNOSES:  AXIS I:  Schizophrenia, chronic, with acute  exacerbation, resolving.  AXIS II: Deferred.  AXIS III: History of seizure disorder.  AXIS IV: Stressors moderate.  AXIS V: Global assessment of functioning on discharge 60.   The patient was discharged to his previous group home residential situation.           ______________________________  Anselm Jungling, MD  Electronically Signed     SPB/MEDQ  D:  06/02/2005  T:  06/02/2005  Job:  [id number]

## 2010-10-09 NOTE — H&P (Signed)
Patrick Marks, TRAUGER NO.:  [id number]   MEDICAL RECORD NO.:  [id number]          PATIENT TYPE:  EMS   LOCATION:  MAJO                         FACILITY:  [facility]   PHYSICIAN:  Kela Millin, M.D.DATE OF BIRTH:  07-Jan-1966   DATE OF ADMISSION:  03/11/2006  DATE OF DISCHARGE:                                HISTORY & PHYSICAL   PRIMARY CARE PHYSICIAN:  Unassigned.   CHIEF COMPLAINT:  Chest pain.   HISTORY OF PRESENT ILLNESS:  The patient is a 45 year old black male  resident of a group home with a past medical his significant for  tuberculosis on anti-TB drugs (exact duration of treatment so far is  unclear), history of schizophrenia and seizure disorder who presents with  complaints of chest pain. He states that the chest pain is mid sternal in  location and was going on for about 10 minutes before he came to the ER and  that it was worsened by deep inspiration. He states that he had some  associated shortness of breath initially but not at this time, he denies  nausea, vomiting, diaphoresis and no radiation. The patient also denies  cough, fevers, hemoptysis, hematemesis, diarrhea and no hematochezia.   The patient was seen in the ER and a D-dimer was elevated at 2.31. A CT  angiogram of his chest was done and is positive for PE, he is admitted to  the [facility] hospitalist service for evaluation and management.   PAST MEDICAL HISTORY:  1. As stated above.  2. History of cocaine abuse.   MEDICATIONS:  1. Dilantin 300 mg q.h.s.  2. Isoniazid - rifampin 300.  3. Prolixin 1/2 mL injection, specialized dosing.  4. Trazodone 800 mg q.h.s.  5. Vitamin B 650 mg daily.  6. Prozac 20 mg daily.   ALLERGIES:  PENICILLIN.   SOCIAL HISTORY:  Positive for tobacco 1 pack per day, he denies alcohol.   FAMILY HISTORY:  He denies any family history of blood clot, also denies any  family history of cancer.   REVIEW OF SYSTEMS:  As per HPI.   PHYSICAL EXAMINATION:   GENERAL:  The patient is a middle-aged black male. He  is alert and oriented in no apparent distress.  VITAL SIGNS:  His temperature is 99.5, blood pressure 106/57. His pulse is  67, respiratory rate 21, O2 sat of 98%.  HEENT:  PERRL, EOMI, poor oral dentition with gingival hyperplasia, no oral  exudates, sclera anicteric.  NECK:  Supple, no adenopathy and no thyromegaly.  LUNGS:  Diminished breath sounds bilaterally, no wheezes.  CARDIOVASCULAR:  Regular rate and rhythm, normal S1, S2.  ABDOMEN:  Soft, bowel sounds present, nontender, nondistended, no  organomegaly and no masses palpable.  EXTREMITIES:  No cyanosis and no edema.  NEUROLOGIC:  Alert and oriented x3. Cranial nerves II-XII grossly intact.  Nonfocal exam.   LABORATORY DATA:  CT scan of his chest positive for pulmonary embolus with  fairly large clot burden. Nodular and peripheral opacities in the left lung.  Pulmonary infarct versus a neoplasm cannot be excluded. Left apical air  space disease,  pneumonia versus TB.   The sodium is 137, potassium 3.8, chloride of 107, BUN 6, glucose 95, pH is  7.46 with a pCO2 of 33.9 and his hemoglobin is 13.6 with a hematocrit of 40  and the creatinine is 1.2. His point of care markers are 0.05, the second  troponin of 0.08 and the third troponin is less than 0.05.   Chest x-ray shows emphysema without acute disease. Left base scarring noted.   ASSESSMENT/PLAN:  1. Pulmonary embolus - will obtain a hypercoagulable panel,      anticoagulation, manage PT/INR closely.  2. Left upper lobe nodular opacities/left apical infiltrates - empiric      antibiotics. Also continue anti-TB medications for now. I have      discussed this patient with ID and I have called the [place] [facility]      nurse to obtain further history on this patient and I am awaiting a      call back from her. Follow and consider pulmonary consultation      regarding the nodules.  3. Schizophrenia/psyche - continue  outpatient medications and I have      consulted psyche regarding his medication recommendations.  4. History of seizures - continue Dilantin.      Kela Millin, M.D.  Electronically Signed     ACV/MEDQ  D:  03/11/2006  T:  03/11/2006  Job:  [id number]

## 2010-10-09 NOTE — Op Note (Signed)
NAMENILO, FALLIN               ACCOUNT NO.:  [id number]   MEDICAL RECORD NO.:  [id number]          PATIENT TYPE:  INP   LOCATION:  5743                         FACILITY:  [facility]   PHYSICIAN:  Jefry H. Pollyann Kennedy, MD     DATE OF BIRTH:  1965-06-23   DATE OF PROCEDURE:  12/01/2004  DATE OF DISCHARGE:                                 OPERATIVE REPORT   PREOPERATIVE DIAGNOSIS:  Bilateral parasymphyseal mandibular fracture.   POSTOPERATIVE DIAGNOSIS:  Bilateral parasymphyseal mandibular fracture.   PROCEDURE:  Open reduction internal fixation of bilateral mandibular  parasymphyseal fracture.   SURGEON:  Beverlee Nims, M.D.   ANESTHESIA:  General endotracheal anesthesia was used.   COMPLICATIONS:  Blood loss 100 mL.   FINDINGS:  Comminuted anterior arch fracture with bilateral parasymphyseal  fractures. Dentition in horrendous repair with severe malocclusion.   HISTORY:  This is a 45 year old who fell under unknown circumstances  possibly following a seizure and broke his jaw late last evening. The risks,  benefits, alternatives, and complications of the procedure were explained to  the patient's who seemed to understand and agreed to surgery.   PROCEDURE:  The patient was taken to the operating room and placed on the  operating table in the supine position. Following induction of general  endotracheal anesthesia via nasotracheal tube, the face was prepped and  draped in a standard fashion. There was a transverse laceration underneath  the chin approximately 7 cm in length. This had been sutured in the  emergency room. This was opened and dissection was carried down to the  mandibular fracture to the left parasymphyseal fracture exposing the  fracture nicely. An additional intraoral incision was created in the right  gingivolabial sulcus to expose the right-sided parasymphyseal fracture. Both  fractures were exposed and cleaned off of the periosteum and mandibular  compression plates  2.7 mm Leibinger were used. Each four holes, the left  side with a curved plate, and a straight plate on the right. Various sized  screws were used 8-12 mm and the fractures were manually reduced holding the  mandible in what was felt to be his a premorbid occlusion and the fractures  were fixated bilaterally. The wounds were irrigated. The intraoral incision  was reapproximated with a running interlocking Vicryl suture. The chin  laceration was reapproximated with two layers of chromic closure. The  patient was then awakened, extubated, and transferred to recovery in stable  condition.      JHR/MEDQ  D:  12/02/2004  T:  12/02/2004  Job:  [id number]

## 2011-03-01 ENCOUNTER — Emergency Department (HOSPITAL_COMMUNITY)
Admission: EM | Admit: 2011-03-01 | Discharge: 2011-03-01 | Disposition: A | Discharge status: Home / Self Care | Payer: Medicaid Other | Attending: Emergency Medicine | Admitting: Emergency Medicine

## 2011-03-01 DIAGNOSIS — G40909 Epilepsy, unspecified, not intractable, without status epilepticus: Secondary | ICD-10-CM | POA: Insufficient documentation

## 2011-03-01 DIAGNOSIS — Z202 Contact with and (suspected) exposure to infections with a predominantly sexual mode of transmission: Secondary | ICD-10-CM | POA: Insufficient documentation

## 2011-03-01 DIAGNOSIS — Z8679 Personal history of other diseases of the circulatory system: Secondary | ICD-10-CM | POA: Insufficient documentation

## 2011-03-01 LAB — PROTIME-INR
INR: 2.6 — ABNORMAL HIGH
Prothrombin Time: 29 — ABNORMAL HIGH

## 2011-03-02 LAB — RAPID URINE DRUG SCREEN, HOSP PERFORMED
Amphetamines: NOT DETECTED
Barbiturates: NOT DETECTED
Benzodiazepines: NOT DETECTED
Opiates: NOT DETECTED
Tetrahydrocannabinol: NOT DETECTED

## 2011-03-02 LAB — COMPREHENSIVE METABOLIC PANEL
ALT: 71 — ABNORMAL HIGH
AST: 50 — ABNORMAL HIGH
Albumin: 3.7
Calcium: 8.9
Creatinine, Ser: 0.83
GFR calc Af Amer: >60
Sodium: 140
Total Protein: 6.8

## 2011-03-02 LAB — DIFFERENTIAL
Eosinophils Absolute: 0.1 — ABNORMAL LOW
Eosinophils Relative: 1
Lymphocytes Relative: 14
Lymphs Abs: 1.5
Monocytes Absolute: 0.6
Monocytes Relative: 6

## 2011-03-02 LAB — CBC
MCHC: 34
MCV: 82.6
Platelets: 184

## 2011-03-02 LAB — PHENYTOIN LEVEL, TOTAL: Phenytoin Lvl: 11.9

## 2011-03-05 ENCOUNTER — Emergency Department (HOSPITAL_COMMUNITY)
Admission: EM | Admit: 2011-03-05 | Discharge: 2011-03-05 | Disposition: A | Discharge status: Home / Self Care | Payer: Medicaid Other | Attending: Emergency Medicine | Admitting: Emergency Medicine

## 2011-03-05 DIAGNOSIS — Y92009 Unspecified place in unspecified non-institutional (private) residence as the place of occurrence of the external cause: Secondary | ICD-10-CM | POA: Insufficient documentation

## 2011-03-05 DIAGNOSIS — Z8679 Personal history of other diseases of the circulatory system: Secondary | ICD-10-CM | POA: Insufficient documentation

## 2011-03-05 DIAGNOSIS — X500XXA Overexertion from strenuous movement or load, initial encounter: Secondary | ICD-10-CM | POA: Insufficient documentation

## 2011-03-05 DIAGNOSIS — G40909 Epilepsy, unspecified, not intractable, without status epilepticus: Secondary | ICD-10-CM | POA: Insufficient documentation

## 2011-03-05 DIAGNOSIS — S335XXA Sprain of ligaments of lumbar spine, initial encounter: Secondary | ICD-10-CM | POA: Insufficient documentation

## 2011-03-05 DIAGNOSIS — F209 Schizophrenia, unspecified: Secondary | ICD-10-CM | POA: Insufficient documentation

## 2011-08-22 ENCOUNTER — Emergency Department (HOSPITAL_COMMUNITY)
Admission: EM | Admit: 2011-08-22 | Discharge: 2011-08-23 | Disposition: A | Discharge status: Home / Self Care | Payer: Medicaid Other | Attending: Emergency Medicine | Admitting: Emergency Medicine

## 2011-08-22 ENCOUNTER — Emergency Department (HOSPITAL_COMMUNITY): Payer: Medicaid Other

## 2011-08-22 ENCOUNTER — Encounter (HOSPITAL_COMMUNITY): Payer: Self-pay | Admitting: *Deleted

## 2011-08-22 DIAGNOSIS — R42 Dizziness and giddiness: Secondary | ICD-10-CM | POA: Insufficient documentation

## 2011-08-22 DIAGNOSIS — Z79899 Other long term (current) drug therapy: Secondary | ICD-10-CM | POA: Insufficient documentation

## 2011-08-22 DIAGNOSIS — R5381 Other malaise: Secondary | ICD-10-CM | POA: Insufficient documentation

## 2011-08-22 DIAGNOSIS — R5383 Other fatigue: Secondary | ICD-10-CM | POA: Insufficient documentation

## 2011-08-22 DIAGNOSIS — Z8673 Personal history of transient ischemic attack (TIA), and cerebral infarction without residual deficits: Secondary | ICD-10-CM | POA: Insufficient documentation

## 2011-08-22 DIAGNOSIS — R29898 Other symptoms and signs involving the musculoskeletal system: Secondary | ICD-10-CM | POA: Insufficient documentation

## 2011-08-22 DIAGNOSIS — R531 Weakness: Secondary | ICD-10-CM

## 2011-08-22 DIAGNOSIS — I2510 Atherosclerotic heart disease of native coronary artery without angina pectoris: Secondary | ICD-10-CM | POA: Insufficient documentation

## 2011-08-22 HISTORY — DX: Atherosclerotic heart disease of native coronary artery without angina pectoris: I25.10

## 2011-08-22 HISTORY — DX: Unspecified convulsions: R56.9

## 2011-08-22 HISTORY — DX: Cerebral infarction, unspecified: I63.9

## 2011-08-22 LAB — DIFFERENTIAL
Eosinophils Absolute: 0.2 10*3/uL (ref 0.0–0.7)
Eosinophils Relative: 2 % (ref 0–5)
Lymphocytes Relative: 29 % (ref 12–46)
Lymphs Abs: 3.2 10*3/uL (ref 0.7–4.0)
Monocytes Absolute: 0.6 10*3/uL (ref 0.1–1.0)
Monocytes Relative: 6 % (ref 3–12)

## 2011-08-22 LAB — COMPREHENSIVE METABOLIC PANEL
ALT: 25 U/L (ref 0–53)
BUN: 16 mg/dL (ref 6–23)
CO2: 26 mEq/L (ref 19–32)
Calcium: 9.5 mg/dL (ref 8.4–10.5)
Creatinine, Ser: 0.91 mg/dL (ref 0.50–1.35)
GFR calc Af Amer: >90 mL/min (ref >90)
GFR calc non Af Amer: >90 mL/min (ref >90)
Glucose, Bld: 87 mg/dL (ref 70–99)

## 2011-08-22 LAB — CBC
HCT: 32.9 % — ABNORMAL LOW (ref 39.0–52.0)
MCH: 26.8 pg (ref 26.0–34.0)
MCV: 82.3 fL (ref 78.0–100.0)
RBC: 4 MIL/uL — ABNORMAL LOW (ref 4.22–5.81)
WBC: 10.9 10*3/uL — ABNORMAL HIGH (ref 4.0–10.5)

## 2011-08-22 LAB — URINALYSIS, ROUTINE W REFLEX MICROSCOPIC
Leukocytes, UA: NEGATIVE
Nitrite: NEGATIVE
Specific Gravity, Urine: 1.029 (ref 1.005–1.030)
Urobilinogen, UA: 1 mg/dL (ref 0.0–1.0)

## 2011-08-22 LAB — POCT I-STAT TROPONIN I

## 2011-08-22 NOTE — ED Notes (Signed)
Pt was sitting on couch watching TV and began experiencing acute onset weakness.  Denies sob, dizziness, nausea, numbness.  Pt with hx of stroke and stent placement.  Pt ao x 4 per ems, speaking with a slur, but per family is the norm.  CBG 123.  Presently, pt feels that he is doing better.

## 2011-08-22 NOTE — ED Notes (Signed)
Pt back from X-ray.  

## 2011-08-22 NOTE — ED Provider Notes (Signed)
History     CSN: [id number]  Arrival date & time 08/22/11  2032   First MD Initiated Contact with Patient 08/22/11 2054      Chief Complaint  Patient presents with  . Weakness    generalized    (Consider location/radiation/quality/duration/timing/severity/associated sxs/prior treatment) Patient is a 46 y.o. male presenting with weakness. The history is provided by the patient.  Weakness The primary symptoms include dizziness. Primary symptoms do not include headaches, seizures or fever.  Dizziness also occurs with weakness.  Additional symptoms include weakness.  Pt states he was sitting watching TV when he suddenly felt weak all over. States he got up to "walk it out" and when got up felt dizzy, so he called EMS. States he is feeling well otherwise. Upon arrival, he has no complaints. Denies headache, nausea, vomiting, chest pain, SOB, urinary syptoms. States he has had some cough recently. Denies fever, or chills.   Past Medical History  Diagnosis Date  . CVA (cerebral infarction)   . Coronary artery disease     stent placement  . Seizures     Past Surgical History  Procedure Date  . Coronary angioplasty with stent placement   . Coronary angioplasty with stent placement     No family history on file.  History  Substance Use Topics  . Smoking status: Not on file  . Smokeless tobacco: Not on file  . Alcohol Use:       Review of Systems  Constitutional: Negative for fever and chills.  HENT: Negative.   Eyes: Negative.   Respiratory: Negative.   Cardiovascular: Negative.   Gastrointestinal: Negative.   Genitourinary: Negative.   Musculoskeletal: Negative.   Skin: Negative.   Neurological: Positive for dizziness, weakness and light-headedness. Negative for seizures, syncope, facial asymmetry, speech difficulty, numbness and headaches.  Psychiatric/Behavioral: Negative.     Allergies  Penicillins  Home Medications   Current Outpatient Rx  Name Route Sig  Dispense Refill  . ALBUTEROL SULFATE (2.5 MG/3ML) 0.083% IN NEBU Nebulization Take 2.5 mg by nebulization every 6 (six) hours as needed. For shortness of breath    . BENZTROPINE MESYLATE 0.5 MG PO TABS Oral Take 0.5 mg by mouth 2 (two) times daily.    . DULOXETINE HCL 60 MG PO CPEP Oral Take 60 mg by mouth daily.    Marland Kitchen FLUPHENAZINE DECANOATE 25 MG/ML IJ SOLN Intramuscular Inject 25 mg into the muscle every 14 (fourteen) days.    . IBUPROFEN 800 MG PO TABS Oral Take 800 mg by mouth 3 (three) times daily.    Marland Kitchen LORATADINE 10 MG PO TABS Oral Take 10 mg by mouth daily.    Marland Kitchen METHOCARBAMOL 750 MG PO TABS Oral Take 750 mg by mouth 3 (three) times daily.    . ADULT MULTIVITAMIN W/MINERALS CH Oral Take 1 tablet by mouth daily.    Marland Kitchen PHENYTOIN SODIUM EXTENDED 100 MG PO CAPS Oral Take 200 mg by mouth at bedtime.    . TRAZODONE HCL 100 MG PO TABS Oral Take 100 mg by mouth at bedtime.      BP 104/77  Pulse 84  Temp(Src) 97.9 F (36.6 C) (Oral)  Resp 17  SpO2 95%  Physical Exam  Nursing note and vitals reviewed. Constitutional: He is oriented to person, place, and time. He appears well-developed and well-nourished. No distress.  HENT:  Head: Normocephalic and atraumatic.       Some facial symmetry noted, per pt and EMS that is not new  Eyes:  Conjunctivae are normal.  Neck: Neck supple.  Cardiovascular: Normal rate, regular rhythm and normal heart sounds.   Pulmonary/Chest: Effort normal and breath sounds normal. No respiratory distress. He has no wheezes.  Abdominal: Soft. Bowel sounds are normal. He exhibits no distension. There is no tenderness. There is no rebound.  Musculoskeletal: Normal range of motion. He exhibits no edema and no tenderness.  Lymphadenopathy:    He has no cervical adenopathy.  Neurological: He is alert and oriented to person, place, and time. No cranial nerve deficit. He exhibits normal muscle tone. Coordination normal.       5/5 and equal UE and LE strength bilaterally.  Equal grip strength, no pronator drift, normal finger to nose, gait normal.   Skin: Skin is warm and dry.  Psychiatric: He has a normal mood and affect.    ED Course  Procedures (including critical care time)  Labs Reviewed  CBC - Abnormal; Notable for the following:    WBC 10.9 (*)    RBC 4.00 (*)    Hemoglobin 10.7 (*)    HCT 32.9 (*)    Platelets 146 (*)    All other components within normal limits  COMPREHENSIVE METABOLIC PANEL - Abnormal; Notable for the following:    Potassium 3.4 (*)    Total Bilirubin 0.1 (*)    All other components within normal limits  URINALYSIS, ROUTINE W REFLEX MICROSCOPIC - Abnormal; Notable for the following:    Bilirubin Urine SMALL (*)    All other components within normal limits  DIFFERENTIAL  POCT I-STAT TROPONIN I   Dg Chest 2 View  08/22/2011  *RADIOLOGY REPORT*  Clinical Data: Weakness in the legs.  Lightheadedness.  CHEST - 2 VIEW  Comparison: 03/11/2006  Findings: Normal heart size and pulmonary vascularity. Hyperinflation suggesting emphysema.  No focal airspace consolidation in the lungs.  No blunting of costophrenic angles. No pneumothorax.  Old left rib fracture.  No significant change since previous study.  IMPRESSION: No evidence of active pulmonary disease.  Emphysematous changes.  Original Report Authenticated By: Marlon Pel, M.D.    Date: 08/23/2011  Rate: 64  Rhythm: normal sinus rhythm  QRS Axis: normal  Intervals: normal  ST/T Wave abnormalities: nonspecific T wave changes and ST elevations diffusely  Conduction Disutrbances:none  Narrative Interpretation:   Old EKG Reviewed: unchanged   Pt with sudden onset of "weakness." He cannot really explain what that weakness was. Denies focal weakness, denies feeling "bad." Dizziness upon standing. VS here normal. Although his HR increases with standing from 63 to 84, his blood pressure remains similar. I did give pt some fluids. His labs and x-ray did not show any  significant finding. He is afebrile. He has no complaints. Will d/c home with close follow up.    No diagnosis found.    MDM          Lottie Mussel, PA 08/23/11 [phone]

## 2011-08-22 NOTE — ED Notes (Signed)
Pt denies dizziness, weakness or pain at this time.

## 2011-08-22 NOTE — ED Notes (Signed)
Pt notified that we need a urine sample. Pt given urinal

## 2011-08-23 ENCOUNTER — Other Ambulatory Visit: Payer: Self-pay

## 2011-08-23 NOTE — ED Notes (Signed)
Owner of pt home stated that she was going to pick pt up and requested that he sit in waiting room.

## 2011-08-23 NOTE — Discharge Instructions (Signed)
Your blood work, urine analysis, chest x-ray did not show any abnormalities. Make sure to take your inhaler. Follow up with your doctor in 2-3 days for recheck.   Weakness Your exam shows you have weakness without a known cause. This may require further medical evaluation. Weakness can be caused by infections, physical exhaustion, internal bleeding, dehydration, medication side effects, and emotional upsets. Problems with circulation, heart disease, and nervous system disorders can also make you weak. Weakness can cause dizziness and fainting; this is usually due to a low blood pressure. Full medical evaluation may require additional laboratory and x-ray tests, so be sure to see your doctor for follow up care as recommended. You should get plenty of rest and eat a nutritious diet over the next weeks. If you become very dizzy, nauseated, or feel like you are going to faint, please lie down flat right away and wait until all your symptoms have passed before you get up again. You should see your doctor or go to the emergency room right away if you have any of the following problems:  Severe headache, chest pain, or abdominal pain.   An irregular heartbeat or a very fast pulse (over 120 beats/minute).   Chest pain, chest pressure, and/or difficulty breathing.   Confusion, vision problems, difficulty walking, fever or chills.  Document Released: 05/10/2005 Document Revised: 04/29/2011 Document Reviewed: 10/20/2006 [facility] Patient Information 2012 [place], [place].

## 2011-08-23 NOTE — ED Notes (Signed)
Secretary Danielle attempting to find pt a ride home.  Await owner to find someone to come pick pt up.

## 2011-08-23 NOTE — ED Notes (Signed)
Phone call to owner of [employer]  At [phone] related to transportation of client home. She states she would call some one to transport patient.

## 2011-08-30 NOTE — ED Provider Notes (Signed)
Medical screening examination/treatment/procedure(s) were performed by non-physician practitioner and as supervising physician I was immediately available for consultation/collaboration.  Yael Angerer, MD 08/30/11 1457 

## 2013-04-10 ENCOUNTER — Other Ambulatory Visit: Payer: Self-pay | Admitting: Otolaryngology

## 2013-04-10 DIAGNOSIS — R1313 Dysphagia, pharyngeal phase: Secondary | ICD-10-CM

## 2013-04-17 ENCOUNTER — Ambulatory Visit
Admission: RE | Admit: 2013-04-17 | Discharge: 2013-04-17 | Disposition: A | Discharge status: Home / Self Care | Payer: Medicaid Other | Source: Ambulatory Visit | Attending: Otolaryngology | Admitting: Otolaryngology

## 2013-04-17 DIAGNOSIS — R1313 Dysphagia, pharyngeal phase: Secondary | ICD-10-CM

## 2019-07-10 ENCOUNTER — Ambulatory Visit (LOCAL_COMMUNITY_HEALTH_CENTER): Payer: Medicaid Other

## 2019-07-10 ENCOUNTER — Other Ambulatory Visit: Payer: Self-pay

## 2019-07-10 DIAGNOSIS — Z111 Encounter for screening for respiratory tuberculosis: Secondary | ICD-10-CM

## 2019-07-13 ENCOUNTER — Ambulatory Visit (LOCAL_COMMUNITY_HEALTH_CENTER): Payer: Medicaid Other

## 2019-07-13 DIAGNOSIS — Z111 Encounter for screening for respiratory tuberculosis: Secondary | ICD-10-CM

## 2019-07-13 LAB — TB SKIN TEST
Induration: 8 mm
TB Skin Test: NEGATIVE

## 2019-07-13 NOTE — Progress Notes (Signed)
Patient had 72mm area of redness; questionable induration on one side. Patient needs 2 step so will RTC in a 1-2 weeks Richmond Campbell, RN

## 2019-07-24 ENCOUNTER — Ambulatory Visit (LOCAL_COMMUNITY_HEALTH_CENTER): Payer: Self-pay

## 2019-07-24 ENCOUNTER — Other Ambulatory Visit: Payer: Self-pay

## 2019-07-24 DIAGNOSIS — Z111 Encounter for screening for respiratory tuberculosis: Secondary | ICD-10-CM

## 2019-07-27 ENCOUNTER — Ambulatory Visit
Admission: RE | Admit: 2019-07-27 | Discharge: 2019-07-27 | Disposition: A | Discharge status: Home / Self Care | Payer: Medicaid Other | Source: Ambulatory Visit | Attending: Family Medicine | Admitting: Family Medicine

## 2019-07-27 ENCOUNTER — Ambulatory Visit (LOCAL_COMMUNITY_HEALTH_CENTER): Payer: Medicaid Other

## 2019-07-27 ENCOUNTER — Other Ambulatory Visit: Payer: Self-pay

## 2019-07-27 VITALS — Wt 122.5 lb

## 2019-07-27 DIAGNOSIS — R7611 Nonspecific reaction to tuberculin skin test without active tuberculosis: Secondary | ICD-10-CM | POA: Diagnosis present

## 2019-07-27 LAB — TB SKIN TEST
Induration: 15 mm
TB Skin Test: POSITIVE

## 2019-07-28 LAB — CBC WITH DIFFERENTIAL/PLATELET
Basophils Absolute: 0.1 10*3/uL (ref 0.0–0.2)
Basos: 1 %
EOS (ABSOLUTE): 0.1 10*3/uL (ref 0.0–0.4)
Eos: 1 %
Hematocrit: 41.9 % (ref 37.5–51.0)
Hemoglobin: 13.5 g/dL (ref 13.0–17.7)
Immature Grans (Abs): 0 10*3/uL (ref 0.0–0.1)
Immature Granulocytes: 0 %
Lymphocytes Absolute: 1.8 10*3/uL (ref 0.7–3.1)
Lymphs: 26 %
MCH: 27.6 pg (ref 26.6–33.0)
MCHC: 32.2 g/dL (ref 31.5–35.7)
MCV: 86 fL (ref 79–97)
Monocytes Absolute: 0.4 10*3/uL (ref 0.1–0.9)
Monocytes: 6 %
Neutrophils Absolute: 4.5 10*3/uL (ref 1.4–7.0)
Neutrophils: 66 %
Platelets: 211 10*3/uL (ref 150–450)
RBC: 4.9 x10E6/uL (ref 4.14–5.80)
RDW: 14.5 % (ref 11.6–15.4)
WBC: 6.9 10*3/uL (ref 3.4–10.8)

## 2019-07-28 LAB — HEPATIC FUNCTION PANEL
ALT: 60 IU/L — ABNORMAL HIGH (ref 0–44)
AST: 47 IU/L — ABNORMAL HIGH (ref 0–40)
Albumin: 4.7 g/dL (ref 3.8–4.9)
Alkaline Phosphatase: 83 IU/L (ref 39–117)
Bilirubin Total: <0.2 mg/dL (ref 0.0–1.2)
Bilirubin, Direct: 0.07 mg/dL (ref 0.00–0.40)
Total Protein: 7.6 g/dL (ref 6.0–8.5)

## 2019-07-30 ENCOUNTER — Encounter: Payer: Self-pay | Admitting: Family Medicine

## 2019-07-30 NOTE — Progress Notes (Signed)
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  Reports took Rifampin for a short period of time but had to d/c d/t N&V. Caregiver from group home states +PPD is not on his record. Will send for CXR.  Given INH info and LTBI vs Active TB info. TB RN will contact Liliane Channel (caregiver with [facility]) once RN has CXR results and they have been reviewed with [facility].  [facility] is to fax TB RN patient's current med list.  Baseline labs, HIV/RPR also drawn. Richmond Campbell, RN

## 2019-07-31 ENCOUNTER — Telehealth: Payer: Self-pay

## 2019-07-31 NOTE — Telephone Encounter (Signed)
TC with patient caregiver at [facility], Agustin Cree.  Discussed normal CXR and LTBI tx. Darlene will fax TB RN patient's med list in the am. TB RN informed her that once Dr. Alvester Morin checks drug interactions, she will right orders for LTBI tx.  TB RN will call patient back in once orders are complete. Richmond Campbell, RN

## 2019-08-01 ENCOUNTER — Other Ambulatory Visit: Payer: Self-pay | Admitting: Family Medicine

## 2019-08-01 ENCOUNTER — Other Ambulatory Visit: Payer: Self-pay

## 2019-08-01 DIAGNOSIS — R569 Unspecified convulsions: Secondary | ICD-10-CM | POA: Insufficient documentation

## 2019-08-01 DIAGNOSIS — Z227 Latent tuberculosis: Secondary | ICD-10-CM

## 2019-08-01 MED ORDER — ISONIAZID 300 MG PO TABS: 300.0000 mg | ORAL_TABLET | Freq: Every day | ORAL | 5 refills | Status: DC

## 2019-08-01 NOTE — Addendum Note (Signed)
Addended by: Richmond Campbell on: 08/01/2019 03:01 PM   Modules accepted: Orders

## 2019-08-01 NOTE — Progress Notes (Signed)
Tuberculosis treatment orders  All patients are to be monitored per [place] and county TB policies.   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 Treat for latent TB per the following:  Isoniazid 300mg  PO once daily x 6 months per [place] TB control policy.  Patient is currently taking 50mg  Vitamin B6. In the event that his PCP discontinues the Vitamin B6, [facility] RN may give patient Vtiamin B6 25mg  daily #30 for the remainder of patient's INH LTBI tx regimen.  Monthly LFTs please.  Medication list updated 08/01/19 via fax from group home director.  Patient reports N&V in the past with Rifampin.  +PPD/CXR 07/27/2019 Denies s/sx of Active TB

## 2019-08-06 NOTE — Telephone Encounter (Signed)
#  1 INH LTBI appt scheduled for 08/09/19 Richmond Campbell, RN

## 2019-08-09 ENCOUNTER — Telehealth: Payer: Self-pay

## 2019-08-09 NOTE — Telephone Encounter (Signed)
TC from caregiver Darlene. States she spoke with patient's sister.  Sister reported taking patient to TB appts for his "+ TB test" Darlene unsure duration of treatment but states sister reported that patient was treated in [facility].  LTBI appt cancelled for today and TB RN will call Ferd Hibbs, RN    TC with [facility] TB nurse.  States they have no record of the patient ever being seen at [facility]. D. Richmond Campbell, RN   TC back to caregive. Left message requesting patient's sister name and #. Richmond Campbell, RN

## 2019-10-09 NOTE — Telephone Encounter (Signed)
Attempted TC to sister x 2 in order to obtain more info re: previous TB treatment.  Sister informed care giver, Agustin Cree, that she took him to all of his TB appts years ago.  Closing to TB f/u Richmond Campbell, RN   Sister # [phone] Darlene # [phone]

## 2020-07-18 ENCOUNTER — Ambulatory Visit: Payer: Self-pay | Admitting: Podiatry

## 2020-07-25 ENCOUNTER — Encounter: Payer: Self-pay | Admitting: Podiatry

## 2020-07-25 ENCOUNTER — Ambulatory Visit (INDEPENDENT_AMBULATORY_CARE_PROVIDER_SITE_OTHER): Payer: Medicaid Other | Admitting: Podiatry

## 2020-07-25 ENCOUNTER — Other Ambulatory Visit: Payer: Self-pay

## 2020-07-25 DIAGNOSIS — M79671 Pain in right foot: Secondary | ICD-10-CM

## 2020-07-25 DIAGNOSIS — L989 Disorder of the skin and subcutaneous tissue, unspecified: Secondary | ICD-10-CM | POA: Diagnosis not present

## 2020-07-25 NOTE — Progress Notes (Signed)
   Subjective: 55 y.o. male presenting to the office today for evaluation of calluses that have developed to the plantar aspect of the right foot.  Patient states is very painful to walk.  They have been present for approximately 2-3 years.  He has not done anything for treatment.  He denies a history of injury.   Past Medical History:  Diagnosis Date  . Coronary artery disease    stent placement  . CVA (cerebral infarction)   . Seizures (HCC)      Objective:  Physical Exam General: Alert and oriented x3 in no acute distress  Dermatology: Hyperkeratotic lesion(s) present on the plantar aspect of the subfifth MTPJ and first MTPJ right foot. Pain on palpation with a central nucleated core noted. Skin is warm, dry and supple bilateral lower extremities. Negative for open lesions or macerations.  Vascular: Palpable pedal pulses bilaterally. No edema or erythema noted. Capillary refill within normal limits.  Neurological: Epicritic and protective threshold grossly intact bilaterally.   Musculoskeletal Exam: Pain on palpation at the keratotic lesion(s) noted. Range of motion within normal limits bilateral. Muscle strength 5/5 in all groups bilateral.  Assessment: 1.  Benign preulcerative callus lesions right foot x2   Plan of Care:  1. Patient evaluated 2. Excisional debridement of keratoic lesion(s) using a chisel blade was performed without incident.  3.  Recommend daily foot hygiene and OTC corn callus remover  4. Patient is to return to the clinic PRN.   Felecia Shelling, DPM [facility]  Dr. Felecia Shelling, DPM    [address] [place], [place] [address]                Office [phone]  Fax [phone]

## 2020-08-08 ENCOUNTER — Other Ambulatory Visit: Payer: Self-pay

## 2020-08-08 ENCOUNTER — Emergency Department (HOSPITAL_COMMUNITY)
Admission: EM | Admit: 2020-08-08 | Discharge: 2020-08-09 | Disposition: A | Discharge status: Home / Self Care | Payer: Medicaid Other | Attending: Emergency Medicine | Admitting: Emergency Medicine

## 2020-08-08 ENCOUNTER — Encounter (HOSPITAL_COMMUNITY): Payer: Self-pay

## 2020-08-08 DIAGNOSIS — R55 Syncope and collapse: Secondary | ICD-10-CM | POA: Diagnosis present

## 2020-08-08 DIAGNOSIS — F17200 Nicotine dependence, unspecified, uncomplicated: Secondary | ICD-10-CM | POA: Insufficient documentation

## 2020-08-08 DIAGNOSIS — I2510 Atherosclerotic heart disease of native coronary artery without angina pectoris: Secondary | ICD-10-CM | POA: Diagnosis not present

## 2020-08-08 DIAGNOSIS — Z955 Presence of coronary angioplasty implant and graft: Secondary | ICD-10-CM | POA: Insufficient documentation

## 2020-08-08 DIAGNOSIS — Z7901 Long term (current) use of anticoagulants: Secondary | ICD-10-CM | POA: Insufficient documentation

## 2020-08-08 LAB — BASIC METABOLIC PANEL
Anion gap: 6 (ref 5–15)
BUN: 13 mg/dL (ref 6–20)
CO2: 27 mmol/L (ref 22–32)
Calcium: 9.1 mg/dL (ref 8.9–10.3)
Chloride: 105 mmol/L (ref 98–111)
Creatinine, Ser: 1.01 mg/dL (ref 0.61–1.24)
GFR, Estimated: >60 mL/min (ref >60)
Glucose, Bld: 108 mg/dL — ABNORMAL HIGH (ref 70–99)
Potassium: 3.8 mmol/L (ref 3.5–5.1)
Sodium: 138 mmol/L (ref 135–145)

## 2020-08-08 LAB — CBC WITH DIFFERENTIAL/PLATELET
Abs Immature Granulocytes: 0.02 10*3/uL (ref 0.00–0.07)
Basophils Absolute: 0 10*3/uL (ref 0.0–0.1)
Basophils Relative: 1 %
Eosinophils Absolute: 0.1 10*3/uL (ref 0.0–0.5)
Eosinophils Relative: 1 %
HCT: 36.5 % — ABNORMAL LOW (ref 39.0–52.0)
Hemoglobin: 11.9 g/dL — ABNORMAL LOW (ref 13.0–17.0)
Immature Granulocytes: 0 %
Lymphocytes Relative: 14 %
Lymphs Abs: 1.2 10*3/uL (ref 0.7–4.0)
MCH: 27.9 pg (ref 26.0–34.0)
MCHC: 32.6 g/dL (ref 30.0–36.0)
MCV: 85.7 fL (ref 80.0–100.0)
Monocytes Absolute: 0.5 10*3/uL (ref 0.1–1.0)
Monocytes Relative: 6 %
Neutro Abs: 7 10*3/uL (ref 1.7–7.7)
Neutrophils Relative %: 78 %
Platelets: 187 10*3/uL (ref 150–400)
RBC: 4.26 MIL/uL (ref 4.22–5.81)
RDW: 14.8 % (ref 11.5–15.5)
WBC: 8.9 10*3/uL (ref 4.0–10.5)
nRBC: 0 % (ref 0.0–0.2)

## 2020-08-08 LAB — TROPONIN I (HIGH SENSITIVITY)
Troponin I (High Sensitivity): 5 ng/L (ref <18)
Troponin I (High Sensitivity): 4 ng/L (ref <18)

## 2020-08-08 NOTE — ED Notes (Signed)
Pt alert. Sitting in wheelchair. Denies any needs. Pt comfortable. Informed waiting on [facility] for ride home. No ETA available

## 2020-08-08 NOTE — ED Triage Notes (Signed)
Pt BIB [facility] from an adult home c/o a syncopal episode. Pt has intellectual disabilities and lives in a home with supervision. Today staff came outside and found pt slumped over on the bench. Unsure how long pt was there. Pt denies any pain or hitting his head. Staff states they do not believe he fell and hit anything and staff denies any recent injuries or illnesses.

## 2020-08-08 NOTE — ED Notes (Signed)
Discharge instructions discussed with pt. Pt verbalized understanding with no questions at this time. Pt verbalized understanding.   Called group home. Pt able to return. No ride available. Per Eusebio Me, [phone].   Requested Burna Mortimer, Case manager assistance with obtaining ride home to group home located in [place]

## 2020-08-08 NOTE — ED Notes (Signed)
Updated phone number for pt's Group home. [phone]. Trenton Gammon is the manager for the group. [phone]

## 2020-08-08 NOTE — ED Provider Notes (Signed)
[facility] EMERGENCY DEPARTMENT Provider Note   CSN: [id number] Arrival date & time: 08/08/20  1601     History Chief Complaint  Patient presents with  . Loss of Consciousness    Patrick Marks is a 55 y.o. male with a history of seizures with Dilantin, intellectual disability, CVA, coronary disease status post stent, presenting to emergency department with possible syncopal episode.  The patient was at an adult center today and staff reported they found him "slumped in a chair on the porch" per EMS report.  EMS reports the patient was awake, oriented, behaving at his baseline when they arrived.  The patient himself is a poor historian, does not recall the exact events today, but thinks he may have had "a small seizure."  He reports compliance with all his medication including his Dilantin.  The staff reported they did not see the patient fall or hit his head.  The patient denies to me that he has headache.  He said he is otherwise feeling well and at his baseline mental status.  HPI     Past Medical History:  Diagnosis Date  . Coronary artery disease    stent placement  . CVA (cerebral infarction)   . Seizures [facility])     Patient Active Problem List   Diagnosis Date Noted  . Seizures (HCC) 08/01/2019    Past Surgical History:  Procedure Laterality Date  . CORONARY ANGIOPLASTY WITH STENT PLACEMENT    . CORONARY ANGIOPLASTY WITH STENT PLACEMENT         History reviewed. No pertinent family history.  Social History   Tobacco Use  . Smoking status: Current Every Day Smoker  . Smokeless tobacco: Never Used  . Tobacco comment: 9 cigs per day  Vaping Use  . Vaping Use: Never used  Substance Use Topics  . Alcohol use: Not Currently  . Drug use: Not Currently    Home Medications Prior to Admission medications   Medication Sig Start Date End Date Taking? Authorizing Provider  albuterol (VENTOLIN HFA) 108 (90 Base) MCG/ACT inhaler Inhale 2 puffs into  the lungs 4 (four) times daily as needed for wheezing or shortness of breath.   Yes [provider]  baclofen (LIORESAL) 10 MG tablet Take 10 mg by mouth 3 (three) times daily.   Yes [provider]  benztropine (COGENTIN) 0.5 MG tablet Take 0.5 mg by mouth every morning.   Yes [provider]  cholecalciferol (VITAMIN D3) 25 MCG (1000 UNIT) tablet Take 1,000 Units by mouth in the morning.   Yes [provider]  DULoxetine (CYMBALTA) 30 MG capsule Take 30 mg by mouth every morning.   Yes [provider]  fluPHENAZine decanoate (PROLIXIN) 25 MG/ML injection Inject 25 mg into the muscle every 28 (twenty-eight) days.   Yes [provider]  loratadine (CLARITIN) 10 MG tablet Take 10 mg by mouth every morning.   Yes [provider]  LORazepam (ATIVAN) 0.5 MG tablet Take 0.5 mg by mouth 3 (three) times daily.   Yes [provider]  methocarbamol (ROBAXIN) 750 MG tablet Take 750 mg by mouth 3 (three) times daily as needed for muscle spasms.   Yes [provider]  mirtazapine (REMERON) 15 MG tablet Take 15 mg by mouth at bedtime.   Yes [provider]  pantoprazole (PROTONIX) 40 MG tablet Take 40 mg by mouth every morning.   Yes [provider]  phenytoin (DILANTIN) 100 MG ER capsule Take 300 mg by  mouth every morning.   Yes [provider]  pyridOXINE (VITAMIN B-6) 50 MG tablet Take 50 mg by mouth every morning.   Yes [provider]  rivaroxaban (XARELTO) 20 MG TABS tablet Take 20 mg by mouth daily with supper.   Yes [provider]  traZODone (DESYREL) 100 MG tablet Take 100 mg by mouth at bedtime.   Yes [provider]    Allergies    Penicillins  Review of Systems   Review of Systems  Constitutional: Negative for chills and fever.  HENT: Negative for ear pain and sore throat.   Eyes: Negative for pain and visual disturbance.  Respiratory: Negative for cough and  shortness of breath.   Cardiovascular: Negative for chest pain and palpitations.  Gastrointestinal: Negative for abdominal pain and vomiting.  Genitourinary: Negative for dysuria and hematuria.  Musculoskeletal: Negative for arthralgias and back pain.  Skin: Negative for color change and rash.  Neurological: Negative for light-headedness and headaches.  All other systems reviewed and are negative.   Physical Exam Updated Vital Signs BP 110/64   Pulse 63   Temp 97.8 F (36.6 C) (Oral)   Resp 19   SpO2 98%   Physical Exam Constitutional:      General: He is not in acute distress. HENT:     Head: Normocephalic and atraumatic.  Eyes:     Conjunctiva/sclera: Conjunctivae normal.     Pupils: Pupils are equal, round, and reactive to light.  Cardiovascular:     Rate and Rhythm: Normal rate and regular rhythm.  Pulmonary:     Effort: Pulmonary effort is normal. No respiratory distress.  Abdominal:     General: There is no distension.     Tenderness: There is no abdominal tenderness.  Skin:    General: Skin is warm and dry.  Neurological:     General: No focal deficit present.     Mental Status: He is alert and oriented to person, place, and time. Mental status is at baseline.  Psychiatric:        Mood and Affect: Mood normal.        Behavior: Behavior normal.     ED Results / Procedures / Treatments   Labs (all labs ordered are listed, but only abnormal results are displayed) Labs Reviewed  BASIC METABOLIC PANEL - Abnormal; Notable for the following components:      Result Value   Glucose, Bld 108 (*)    All other components within normal limits  CBC WITH DIFFERENTIAL/PLATELET - Abnormal; Notable for the following components:   Hemoglobin 11.9 (*)    HCT 36.5 (*)    All other components within normal limits  PHENYTOIN LEVEL, FREE AND TOTAL  TROPONIN I (HIGH SENSITIVITY)  TROPONIN I (HIGH SENSITIVITY)    EKG EKG Interpretation  Date/Time:  Friday August 08 2020  16:05:39 EDT Ventricular Rate:  52 PR Interval:    QRS Duration: 80 QT Interval:  451 QTC Calculation: 420 R Axis:   58 Text Interpretation: Sinus rhythm Nonspecific T abnrm, anterolateral leads ST elevation, consider inferior injury No STEMI Confirmed by Alvester Chou [phone]) on 08/08/2020 4:46:19 PM   Radiology No results found.  Procedures Procedures   Medications Ordered in ED Medications - No data to display  ED Course  I have reviewed the triage vital signs and the nursing notes.  Pertinent labs & imaging results that were available during my care of the patient were reviewed by me and considered in my medical  decision making (see chart for details).  DDx includes near syncope vs seizure vs dehydration vs hypoglycemia vs anemia vs atypical ACS  He is asymptomatic and back to baseline in the ED. Was not post-ictal per EMS report   No signs or symptoms of infection  I ordered, reviewed, and interpreted labs, which included Trop 4, CBC and BMP largely unremarkable. Dilantin level is send out - he reports compliance with his medication.  I advised neurology f/u as outpatient. ECG per my interpretation showing NSR without acute ischemic findings  After the interventions stated above, I reevaluated the patient and found he remained stable, asymptomatic, eating dinner.  I doubt ACS, PE, sepsis, anemia, or other infection at this time.  Okay for discharge.  Final Clinical Impression(s) / ED Diagnoses Final diagnoses:  Near syncope    Rx / DC Orders ED Discharge Orders    None       , Kermit Balo, MD 08/09/20 1233

## 2020-08-08 NOTE — ED Notes (Signed)
Patrick Marks, family member [phone] would like an update when possible

## 2020-08-08 NOTE — Care Management (Addendum)
Spoke with Abagail Kitchens of [facility] no one available to transport patient as per  Owner.  [facility] contacted and patient is awaiting transport home.

## 2020-08-08 NOTE — Discharge Instructions (Signed)
Revel's workup was reassuring in the ER today.  It is possible he had a break-through seizure.  Please keep taking the seizure medications, and follow up with his neurologist.

## 2020-08-11 LAB — PHENYTOIN LEVEL, FREE AND TOTAL: Phenytoin, Total: 13.4 ug/mL (ref 10.0–20.0)

## 2020-08-15 ENCOUNTER — Emergency Department (HOSPITAL_COMMUNITY)
Admission: EM | Admit: 2020-08-15 | Discharge: 2020-08-16 | Disposition: A | Discharge status: Home / Self Care | Payer: Medicaid Other | Attending: Emergency Medicine | Admitting: Emergency Medicine

## 2020-08-15 ENCOUNTER — Emergency Department (HOSPITAL_COMMUNITY): Payer: Medicaid Other

## 2020-08-15 ENCOUNTER — Encounter (HOSPITAL_COMMUNITY): Payer: Self-pay

## 2020-08-15 DIAGNOSIS — R45851 Suicidal ideations: Secondary | ICD-10-CM | POA: Diagnosis not present

## 2020-08-15 DIAGNOSIS — I2510 Atherosclerotic heart disease of native coronary artery without angina pectoris: Secondary | ICD-10-CM | POA: Insufficient documentation

## 2020-08-15 DIAGNOSIS — Z951 Presence of aortocoronary bypass graft: Secondary | ICD-10-CM | POA: Diagnosis not present

## 2020-08-15 DIAGNOSIS — H53149 Visual discomfort, unspecified: Secondary | ICD-10-CM | POA: Insufficient documentation

## 2020-08-15 DIAGNOSIS — F70 Mild intellectual disabilities: Secondary | ICD-10-CM | POA: Insufficient documentation

## 2020-08-15 DIAGNOSIS — Z20822 Contact with and (suspected) exposure to covid-19: Secondary | ICD-10-CM | POA: Insufficient documentation

## 2020-08-15 DIAGNOSIS — Z8673 Personal history of transient ischemic attack (TIA), and cerebral infarction without residual deficits: Secondary | ICD-10-CM | POA: Insufficient documentation

## 2020-08-15 DIAGNOSIS — Z86711 Personal history of pulmonary embolism: Secondary | ICD-10-CM | POA: Diagnosis not present

## 2020-08-15 DIAGNOSIS — F17290 Nicotine dependence, other tobacco product, uncomplicated: Secondary | ICD-10-CM | POA: Diagnosis not present

## 2020-08-15 DIAGNOSIS — R519 Headache, unspecified: Secondary | ICD-10-CM | POA: Diagnosis present

## 2020-08-15 DIAGNOSIS — R110 Nausea: Secondary | ICD-10-CM | POA: Insufficient documentation

## 2020-08-15 DIAGNOSIS — Z7901 Long term (current) use of anticoagulants: Secondary | ICD-10-CM | POA: Insufficient documentation

## 2020-08-15 LAB — CBC WITH DIFFERENTIAL/PLATELET
Abs Immature Granulocytes: 0.01 10*3/uL (ref 0.00–0.07)
Basophils Absolute: 0.1 10*3/uL (ref 0.0–0.1)
Basophils Relative: 1 %
Eosinophils Absolute: 0.1 10*3/uL (ref 0.0–0.5)
Eosinophils Relative: 2 %
HCT: 37.6 % — ABNORMAL LOW (ref 39.0–52.0)
Hemoglobin: 11.9 g/dL — ABNORMAL LOW (ref 13.0–17.0)
Immature Granulocytes: 0 %
Lymphocytes Relative: 32 %
Lymphs Abs: 2.2 10*3/uL (ref 0.7–4.0)
MCH: 27.2 pg (ref 26.0–34.0)
MCHC: 31.6 g/dL (ref 30.0–36.0)
MCV: 86 fL (ref 80.0–100.0)
Monocytes Absolute: 0.5 10*3/uL (ref 0.1–1.0)
Monocytes Relative: 7 %
Neutro Abs: 4 10*3/uL (ref 1.7–7.7)
Neutrophils Relative %: 58 %
Platelets: 172 10*3/uL (ref 150–400)
RBC: 4.37 MIL/uL (ref 4.22–5.81)
RDW: 14.5 % (ref 11.5–15.5)
WBC: 6.9 10*3/uL (ref 4.0–10.5)
nRBC: 0 % (ref 0.0–0.2)

## 2020-08-15 LAB — COMPREHENSIVE METABOLIC PANEL
ALT: 37 U/L (ref 0–44)
AST: 39 U/L (ref 15–41)
Albumin: 3.6 g/dL (ref 3.5–5.0)
Alkaline Phosphatase: 64 U/L (ref 38–126)
Anion gap: 4 — ABNORMAL LOW (ref 5–15)
BUN: 15 mg/dL (ref 6–20)
CO2: 28 mmol/L (ref 22–32)
Calcium: 9.2 mg/dL (ref 8.9–10.3)
Chloride: 104 mmol/L (ref 98–111)
Creatinine, Ser: 0.99 mg/dL (ref 0.61–1.24)
GFR, Estimated: >60 mL/min (ref >60)
Glucose, Bld: 92 mg/dL (ref 70–99)
Potassium: 4.1 mmol/L (ref 3.5–5.1)
Sodium: 136 mmol/L (ref 135–145)
Total Bilirubin: 0.5 mg/dL (ref 0.3–1.2)
Total Protein: 6.6 g/dL (ref 6.5–8.1)

## 2020-08-15 LAB — URINALYSIS, ROUTINE W REFLEX MICROSCOPIC
Bilirubin Urine: NEGATIVE
Glucose, UA: NEGATIVE mg/dL
Hgb urine dipstick: NEGATIVE
Ketones, ur: NEGATIVE mg/dL
Leukocytes,Ua: NEGATIVE
Nitrite: NEGATIVE
Protein, ur: NEGATIVE mg/dL
Specific Gravity, Urine: 1.026 (ref 1.005–1.030)
pH: 6 (ref 5.0–8.0)

## 2020-08-15 LAB — RAPID URINE DRUG SCREEN, HOSP PERFORMED
Amphetamines: NOT DETECTED
Barbiturates: NOT DETECTED
Benzodiazepines: POSITIVE — AB
Cocaine: NOT DETECTED
Opiates: NOT DETECTED
Tetrahydrocannabinol: NOT DETECTED

## 2020-08-15 MED ORDER — PANTOPRAZOLE SODIUM 40 MG PO TBEC: 40.0000 mg | DELAYED_RELEASE_TABLET | Freq: Every morning | ORAL | Status: DC

## 2020-08-15 MED ORDER — BACLOFEN 10 MG PO TABS
10.0000 mg | ORAL_TABLET | Freq: Three times a day (TID) | ORAL | Status: DC
  Administered 2020-08-15: 10 mg via ORAL
  Filled 2020-08-15: qty 1

## 2020-08-15 MED ORDER — DULOXETINE HCL 30 MG PO CPEP: 30.0000 mg | ORAL_CAPSULE | Freq: Every morning | ORAL | Status: DC

## 2020-08-15 MED ORDER — VITAMIN D 25 MCG (1000 UNIT) PO TABS: 1000.0000 [IU] | ORAL_TABLET | Freq: Every morning | ORAL | Status: DC

## 2020-08-15 MED ORDER — LORAZEPAM 0.5 MG PO TABS
0.5000 mg | ORAL_TABLET | Freq: Three times a day (TID) | ORAL | Status: DC
  Administered 2020-08-15: 0.5 mg via ORAL
  Filled 2020-08-15: qty 1

## 2020-08-15 MED ORDER — ACETAMINOPHEN 325 MG PO TABS
650.0000 mg | ORAL_TABLET | Freq: Once | ORAL | Status: AC
  Administered 2020-08-15: 650 mg via ORAL
  Filled 2020-08-15: qty 2

## 2020-08-15 MED ORDER — VITAMIN B-6 50 MG PO TABS: 50.0000 mg | ORAL_TABLET | Freq: Every morning | ORAL | Status: DC

## 2020-08-15 MED ORDER — LORATADINE 10 MG PO TABS: 10.0000 mg | ORAL_TABLET | Freq: Every morning | ORAL | Status: DC

## 2020-08-15 MED ORDER — TRAZODONE HCL 100 MG PO TABS
100.0000 mg | ORAL_TABLET | Freq: Every day | ORAL | Status: DC
  Administered 2020-08-15: 100 mg via ORAL
  Filled 2020-08-15: qty 1

## 2020-08-15 MED ORDER — ALBUTEROL SULFATE HFA 108 (90 BASE) MCG/ACT IN AERS: 2.0000 | INHALATION_SPRAY | Freq: Four times a day (QID) | RESPIRATORY_TRACT | Status: DC | PRN

## 2020-08-15 MED ORDER — PHENYTOIN SODIUM EXTENDED 100 MG PO CAPS: 300.0000 mg | ORAL_CAPSULE | Freq: Every morning | ORAL | Status: DC

## 2020-08-15 MED ORDER — BENZTROPINE MESYLATE 1 MG PO TABS: 0.5000 mg | ORAL_TABLET | Freq: Every morning | ORAL | Status: DC

## 2020-08-15 MED ORDER — MIRTAZAPINE 15 MG PO TABS
15.0000 mg | ORAL_TABLET | Freq: Every day | ORAL | Status: DC
  Administered 2020-08-15: 15 mg via ORAL
  Filled 2020-08-15: qty 1

## 2020-08-15 MED ORDER — METHOCARBAMOL 500 MG PO TABS: 750.0000 mg | ORAL_TABLET | Freq: Three times a day (TID) | ORAL | Status: DC | PRN

## 2020-08-15 MED ORDER — RIVAROXABAN 20 MG PO TABS: 20.0000 mg | ORAL_TABLET | Freq: Every day | ORAL | Status: DC

## 2020-08-15 NOTE — Discharge Instructions (Addendum)
Follow-up with your primary doctor.  Take Tylenol for headache.  If your suicidal thoughts worsen, your headache worsens, you develop vomiting, fever or other new concern symptom, return to ER for reassessment.
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
## 2020-08-15 NOTE — ED Triage Notes (Signed)
Pt brought in by EMS for suicidal thoughts and a headache.
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
## 2020-08-16 LAB — RESP PANEL BY RT-PCR (FLU A&B, COVID) ARPGX2
Influenza A by PCR: NEGATIVE
Influenza B by PCR: NEGATIVE
SARS Coronavirus 2 by RT PCR: NEGATIVE

## 2020-08-16 NOTE — ED Notes (Signed)
[facility] called  

## 2020-10-09 ENCOUNTER — Encounter: Payer: Self-pay | Admitting: Emergency Medicine

## 2020-10-09 ENCOUNTER — Other Ambulatory Visit: Payer: Self-pay

## 2020-10-09 DIAGNOSIS — R42 Dizziness and giddiness: Secondary | ICD-10-CM | POA: Insufficient documentation

## 2020-10-09 DIAGNOSIS — Z7901 Long term (current) use of anticoagulants: Secondary | ICD-10-CM | POA: Insufficient documentation

## 2020-10-09 DIAGNOSIS — F17200 Nicotine dependence, unspecified, uncomplicated: Secondary | ICD-10-CM | POA: Diagnosis not present

## 2020-10-09 DIAGNOSIS — Z955 Presence of coronary angioplasty implant and graft: Secondary | ICD-10-CM | POA: Diagnosis not present

## 2020-10-09 DIAGNOSIS — I2510 Atherosclerotic heart disease of native coronary artery without angina pectoris: Secondary | ICD-10-CM | POA: Diagnosis not present

## 2020-10-09 NOTE — ED Triage Notes (Addendum)
EMS brings pt in from group home for c/o lightheaded tonight; pt to triage via w/c with no distress noted; pt reports no other c/o or accomp symptoms, denies any recent illness, denies pain

## 2020-10-10 ENCOUNTER — Emergency Department
Admission: EM | Admit: 2020-10-10 | Discharge: 2020-10-10 | Disposition: A | Discharge status: Home / Self Care | Payer: Medicaid Other | Attending: Emergency Medicine | Admitting: Emergency Medicine

## 2020-10-10 DIAGNOSIS — R42 Dizziness and giddiness: Secondary | ICD-10-CM

## 2020-10-10 LAB — CBC WITH DIFFERENTIAL/PLATELET
Abs Immature Granulocytes: 0.02 10*3/uL (ref 0.00–0.07)
Basophils Absolute: 0 10*3/uL (ref 0.0–0.1)
Basophils Relative: 1 %
Eosinophils Absolute: 0.2 10*3/uL (ref 0.0–0.5)
Eosinophils Relative: 3 %
HCT: 37.9 % — ABNORMAL LOW (ref 39.0–52.0)
Hemoglobin: 12.3 g/dL — ABNORMAL LOW (ref 13.0–17.0)
Immature Granulocytes: 0 %
Lymphocytes Relative: 36 %
Lymphs Abs: 2.7 10*3/uL (ref 0.7–4.0)
MCH: 27.4 pg (ref 26.0–34.0)
MCHC: 32.5 g/dL (ref 30.0–36.0)
MCV: 84.4 fL (ref 80.0–100.0)
Monocytes Absolute: 0.5 10*3/uL (ref 0.1–1.0)
Monocytes Relative: 6 %
Neutro Abs: 4.1 10*3/uL (ref 1.7–7.7)
Neutrophils Relative %: 54 %
Platelets: 178 10*3/uL (ref 150–400)
RBC: 4.49 MIL/uL (ref 4.22–5.81)
RDW: 15.3 % (ref 11.5–15.5)
WBC: 7.6 10*3/uL (ref 4.0–10.5)
nRBC: 0 % (ref 0.0–0.2)

## 2020-10-10 LAB — COMPREHENSIVE METABOLIC PANEL
ALT: 46 U/L — ABNORMAL HIGH (ref 0–44)
AST: 46 U/L — ABNORMAL HIGH (ref 15–41)
Albumin: 4.3 g/dL (ref 3.5–5.0)
Alkaline Phosphatase: 71 U/L (ref 38–126)
Anion gap: 8 (ref 5–15)
BUN: 21 mg/dL — ABNORMAL HIGH (ref 6–20)
CO2: 27 mmol/L (ref 22–32)
Calcium: 9.2 mg/dL (ref 8.9–10.3)
Chloride: 101 mmol/L (ref 98–111)
Creatinine, Ser: 1.08 mg/dL (ref 0.61–1.24)
GFR, Estimated: >60 mL/min (ref >60)
Glucose, Bld: 95 mg/dL (ref 70–99)
Potassium: 3.9 mmol/L (ref 3.5–5.1)
Sodium: 136 mmol/L (ref 135–145)
Total Bilirubin: 0.5 mg/dL (ref 0.3–1.2)
Total Protein: 7.7 g/dL (ref 6.5–8.1)

## 2020-10-10 NOTE — ED Provider Notes (Signed)
[facility] Emergency Department Provider Note  Time seen: 6:09 AM  I have reviewed the triage vital signs and the nursing notes.   HISTORY  Chief Complaint Dizziness   HPI Patrick Marks is a 55 y.o. male with a past medical history of prior CVA, seizure disorder, presents to the emergency department for lightheadedness.  According to the patient earlier this evening he had a sensation of feeling lightheaded.  States that has since resolved and denies any complaints at this time.  Denies any chest pain or shortness of breath at any point.  Denies any recent fever cough congestion.  Denies any headache nausea vomiting or diarrhea.  Largely negative review of systems.   Past Medical History:  Diagnosis Date  . Coronary artery disease    stent placement  . CVA (cerebral infarction)   . Seizures [facility])     Patient Active Problem List   Diagnosis Date Noted  . Seizures (HCC) 08/01/2019    Past Surgical History:  Procedure Laterality Date  . CORONARY ANGIOPLASTY WITH STENT PLACEMENT    . CORONARY ANGIOPLASTY WITH STENT PLACEMENT      Prior to Admission medications   Medication Sig Start Date End Date Taking? Authorizing Provider  albuterol (VENTOLIN HFA) 108 (90 Base) MCG/ACT inhaler Inhale 2 puffs into the lungs 4 (four) times daily as needed for wheezing or shortness of breath.    [provider]  baclofen (LIORESAL) 10 MG tablet Take 10 mg by mouth 3 (three) times daily.    [provider]  benztropine (COGENTIN) 0.5 MG tablet Take 0.5 mg by mouth every morning.    [provider]  cholecalciferol (VITAMIN D3) 25 MCG (1000 UNIT) tablet Take 1,000 Units by mouth in the morning.    [provider]  DULoxetine (CYMBALTA) 30 MG capsule Take 30 mg by mouth daily.    [provider]  fluPHENAZine decanoate (PROLIXIN) 25 MG/ML injection Inject 25 mg into the muscle every 28 (twenty-eight) days.    [provider]  loratadine (CLARITIN) 10 MG tablet Take 10 mg by mouth every morning.    [provider]  LORazepam (ATIVAN) 0.5 MG tablet Take 0.5 mg by mouth 3 (three) times daily.    [provider]  methocarbamol (ROBAXIN) 750 MG tablet Take 750 mg by mouth 3 (three) times daily as needed for muscle spasms.    [provider]  mirtazapine (REMERON) 15 MG tablet Take 15 mg by mouth at bedtime.    [provider]  pantoprazole (PROTONIX) 40 MG tablet Take 40 mg by mouth every morning.    [provider]  phenytoin (DILANTIN) 100 MG ER capsule Take 300 mg by mouth every morning.    [provider]  pyridOXINE (VITAMIN B-6) 50 MG tablet Take 50 mg by mouth every morning.    [provider]  rivaroxaban (XARELTO) 20 MG TABS tablet Take 20 mg by mouth daily with supper.    [provider]  traZODone (DESYREL) 100 MG tablet Take 100 mg by mouth at bedtime.    [provider]    Allergies  Allergen Reactions  . Penicillins Other (See Comments)    Unknown reaction    No family history on file.  Social History Social History   Tobacco Use  . Smoking status: Current Every Day Smoker  . Smokeless tobacco: Never Used  . Tobacco comment: 9 cigs per day  Vaping Use  . Vaping Use: Never used  Substance Use Topics  . Alcohol use: Not Currently  . Drug use: Not Currently    Review of Systems Constitutional: Negative for fever.  Lightheadedness earlier tonight now resolved Cardiovascular: Negative for chest pain. Respiratory: Negative for shortness of breath. Gastrointestinal: Negative for abdominal pain, vomiting and diarrhea. Genitourinary: Negative for urinary compaints Musculoskeletal: Negative for musculoskeletal complaints Neurological: Negative for headache All other ROS negative  ____________________________________________   PHYSICAL EXAM:  VITAL SIGNS: ED Triage Vitals  Enc Vitals Group     BP 10/09/20  2342 102/76     Pulse Rate 10/09/20 2342 78     Resp 10/09/20 2342 18     Temp 10/09/20 2342 97.8 F (36.6 C)     Temp Source 10/09/20 2342 Oral     SpO2 10/09/20 2341 99 %     Weight 10/09/20 2342 129 lb (58.5 kg)     Height 10/09/20 2342 6' 3.5" (1.918 m)     Head Circumference --      Peak Flow --      Pain Score 10/09/20 2353 0     Pain Loc --      Pain Edu? --      Excl. in GC? --     Constitutional: Alert and oriented. Well appearing and in no distress. Eyes: Normal exam ENT      Head: Normocephalic and atraumatic.      Mouth/Throat: Mucous membranes are moist. Cardiovascular: Normal rate, regular rhythm. No murmur Respiratory: Normal respiratory effort without tachypnea nor retractions. Breath sounds are clear  Gastrointestinal: Soft and nontender. No distention.  Musculoskeletal: Nontender with normal range of motion in all extremities.  Neurologic:  Normal speech and language. No gross focal neurologic deficits  Skin:  Skin is warm, dry and intact.  Psychiatric: Mood and affect are normal.  ____________________________________________    EKG  EKG viewed and interpreted by myself shows a normal sinus rhythm 82 bpm with a narrow QRS, normal axis, normal intervals, no concerning ST changes  ____________________________________________    INITIAL IMPRESSION / ASSESSMENT AND PLAN / ED COURSE  Pertinent labs & imaging results that were available during my care of the patient were reviewed by me and considered in my medical decision making (see chart for details).   Patient presents to the emergency department from his group facility for lightheadedness earlier this evening which is since resolved.  Patient denies any symptoms currently.  Patient has a reassuring physical exam reassuring vitals, negative review of systems.  Lab work is reassuring.  As the patient appears well with no complaints at this time and resolved lightheadedness I believe the patient is safe for  discharge home.  I discussed with the patient increase fluids at home and PCP follow-up.  Patient agreeable plan of care.  Discussed return precautions.  Patrick Marks was evaluated in Emergency Department on 10/10/2020 for the symptoms described in the history of present illness. He was evaluated in the context of the global COVID-19 pandemic, which necessitated consideration that the patient might be at risk for infection with the SARS-CoV-2 virus that causes COVID-19. Institutional protocols and algorithms that pertain to the evaluation of patients at risk for COVID-19 are in a state of rapid change based on information released by regulatory bodies including the CDC and federal and state organizations. These policies and algorithms were followed during the patient's care in the ED.  ____________________________________________   FINAL CLINICAL IMPRESSION(S) / ED DIAGNOSES  Lightheadedness   Minna Antis, MD 10/10/20 [phone]

## 2020-11-21 ENCOUNTER — Emergency Department
Admission: EM | Admit: 2020-11-21 | Discharge: 2020-11-21 | Disposition: A | Discharge status: Home / Self Care | Payer: Medicaid Other | Attending: Emergency Medicine | Admitting: Emergency Medicine

## 2020-11-21 ENCOUNTER — Other Ambulatory Visit: Payer: Self-pay

## 2020-11-21 ENCOUNTER — Emergency Department: Payer: Medicaid Other

## 2020-11-21 DIAGNOSIS — I2510 Atherosclerotic heart disease of native coronary artery without angina pectoris: Secondary | ICD-10-CM | POA: Diagnosis not present

## 2020-11-21 DIAGNOSIS — R1032 Left lower quadrant pain: Secondary | ICD-10-CM | POA: Insufficient documentation

## 2020-11-21 DIAGNOSIS — K5901 Slow transit constipation: Secondary | ICD-10-CM | POA: Diagnosis not present

## 2020-11-21 DIAGNOSIS — F17200 Nicotine dependence, unspecified, uncomplicated: Secondary | ICD-10-CM | POA: Insufficient documentation

## 2020-11-21 DIAGNOSIS — Z7901 Long term (current) use of anticoagulants: Secondary | ICD-10-CM | POA: Insufficient documentation

## 2020-11-21 LAB — COMPREHENSIVE METABOLIC PANEL
ALT: 71 U/L — ABNORMAL HIGH (ref 0–44)
AST: 43 U/L — ABNORMAL HIGH (ref 15–41)
Albumin: 3.9 g/dL (ref 3.5–5.0)
Alkaline Phosphatase: 67 U/L (ref 38–126)
Anion gap: 4 — ABNORMAL LOW (ref 5–15)
BUN: 18 mg/dL (ref 6–20)
CO2: 30 mmol/L (ref 22–32)
Calcium: 9.3 mg/dL (ref 8.9–10.3)
Chloride: 105 mmol/L (ref 98–111)
Creatinine, Ser: 0.79 mg/dL (ref 0.61–1.24)
GFR, Estimated: >60 mL/min (ref >60)
Glucose, Bld: 95 mg/dL (ref 70–99)
Potassium: 4.5 mmol/L (ref 3.5–5.1)
Sodium: 139 mmol/L (ref 135–145)
Total Bilirubin: 0.5 mg/dL (ref 0.3–1.2)
Total Protein: 7.1 g/dL (ref 6.5–8.1)

## 2020-11-21 LAB — LIPASE, BLOOD: Lipase: 36 U/L (ref 11–51)

## 2020-11-21 LAB — CBC
HCT: 35.5 % — ABNORMAL LOW (ref 39.0–52.0)
Hemoglobin: 11.6 g/dL — ABNORMAL LOW (ref 13.0–17.0)
MCH: 27.8 pg (ref 26.0–34.0)
MCHC: 32.7 g/dL (ref 30.0–36.0)
MCV: 84.9 fL (ref 80.0–100.0)
Platelets: 187 10*3/uL (ref 150–400)
RBC: 4.18 MIL/uL — ABNORMAL LOW (ref 4.22–5.81)
RDW: 15.7 % — ABNORMAL HIGH (ref 11.5–15.5)
WBC: 7.7 10*3/uL (ref 4.0–10.5)
nRBC: 0 % (ref 0.0–0.2)

## 2020-11-21 MED ORDER — POLYETHYLENE GLYCOL 3350 17 GM/SCOOP PO POWD: 17.0000 g | Freq: Every day | ORAL | 0 refills | Status: AC

## 2020-11-21 NOTE — ED Provider Notes (Addendum)
[facility] Emergency Department Provider Note   ____________________________________________   Event Date/Time   First MD Initiated Contact with Patient 11/21/20 2057     (approximate)  I have reviewed the triage vital signs and the nursing notes.   HISTORY  Chief Complaint Abdominal Pain    HPI Patrick Marks is a 55 y.o. male presents for left lower quadrant abdominal pain  LOCATION: Left lower quadrant abdomen DURATION: 24 hours TIMING: Intermittent worsening since onset SEVERITY: Moderate QUALITY: Sharp CONTEXT: Patient also reports history of chronic constipation with hard bowel movement over the last 24 hours and poor p.o. intake MODIFYING FACTORS: Denies ASSOCIATED SYMPTOMS: Constipation   Per medical record review history of chronic constipation          Past Medical History:  Diagnosis Date   Coronary artery disease    stent placement   CVA (cerebral infarction)    Seizures (HCC)     Patient Active Problem List   Diagnosis Date Noted   Seizures (HCC) 08/01/2019    Past Surgical History:  Procedure Laterality Date   CORONARY ANGIOPLASTY WITH STENT PLACEMENT     CORONARY ANGIOPLASTY WITH STENT PLACEMENT      Prior to Admission medications   Medication Sig Start Date End Date Taking? Authorizing Provider  polyethylene glycol powder (GLYCOLAX/MIRALAX) 17 GM/SCOOP powder Take 17 g by mouth daily. 11/21/20  Yes Merwyn Katos, MD  albuterol (VENTOLIN HFA) 108 (90 Base) MCG/ACT inhaler Inhale 2 puffs into the lungs 4 (four) times daily as needed for wheezing or shortness of breath.    [provider]  baclofen (LIORESAL) 10 MG tablet Take 10 mg by mouth 3 (three) times daily.    [provider]  benztropine (COGENTIN) 0.5 MG tablet Take 0.5 mg by mouth every morning.    [provider]  cholecalciferol (VITAMIN D3) 25 MCG (1000 UNIT) tablet Take 1,000 Units by mouth in the morning.    [provider]  DULoxetine (CYMBALTA) 30 MG capsule Take 30 mg by mouth daily.    [provider]  fluPHENAZine decanoate (PROLIXIN) 25 MG/ML injection Inject 25 mg into the muscle every 28 (twenty-eight) days.    [provider]  loratadine (CLARITIN) 10 MG tablet Take 10 mg by mouth every morning.    [provider]  LORazepam (ATIVAN) 0.5 MG tablet Take 0.5 mg by mouth 3 (three) times daily.    [provider]  methocarbamol (ROBAXIN) 750 MG tablet Take 750 mg by mouth 3 (three) times daily as needed for muscle spasms.    [provider]  mirtazapine (REMERON) 15 MG tablet Take 15 mg by mouth at bedtime.    [provider]  pantoprazole (PROTONIX) 40 MG tablet Take 40 mg by mouth every morning.    [provider]  phenytoin (DILANTIN) 100 MG ER capsule Take 300 mg by mouth every morning.    [provider]  pyridOXINE (VITAMIN B-6) 50 MG tablet Take 50 mg by mouth every morning.    [provider]  rivaroxaban (XARELTO) 20 MG TABS tablet Take 20 mg by mouth daily with supper.    [provider]  traZODone (DESYREL) 100 MG tablet Take 100 mg by mouth at bedtime.    [provider]    Allergies Penicillins  No family history on file.  Social History Social History   Tobacco Use   Smoking status: Every Day    Pack years: 0.00  Smokeless tobacco: Never   Tobacco comments:    9 cigs per day  Vaping Use   Vaping Use: Never used  Substance Use Topics   Alcohol use: Not Currently   Drug use: Not Currently    Review of Systems Constitutional: No fever/chills Eyes: No visual changes. ENT: No sore throat. Cardiovascular: Denies chest pain. Respiratory: Denies shortness of breath. Gastrointestinal: Endorses left lower quadrant abdominal pain.  No nausea, no vomiting.  No diarrhea. Genitourinary: Negative for dysuria. Musculoskeletal: Negative for acute arthralgias Skin: Negative for  rash. Neurological: Negative for headaches, weakness/numbness/paresthesias in any extremity Psychiatric: Negative for suicidal ideation/homicidal ideation   ____________________________________________   PHYSICAL EXAM:  VITAL SIGNS: ED Triage Vitals  Enc Vitals Group     BP 11/21/20 1623 104/83     Pulse Rate 11/21/20 1623 89     Resp 11/21/20 1623 16     Temp 11/21/20 1623 98.2 F (36.8 C)     Temp Source 11/21/20 1623 Oral     SpO2 11/21/20 1623 99 %     Weight --      Height 11/21/20 1624 6\' 3"  (1.905 m)     Head Circumference --      Peak Flow --      Pain Score 11/21/20 1623 6     Pain Loc --      Pain Edu? --      Excl. in GC? --    Constitutional: Alert and oriented. Well appearing and in no acute distress. Eyes: Conjunctivae are normal. PERRL. Head: Atraumatic. Nose: No congestion/rhinnorhea. Mouth/Throat: Mucous membranes are moist. Neck: No stridor Cardiovascular: Grossly normal heart sounds.  Good peripheral circulation. Respiratory: Normal respiratory effort.  No retractions. Gastrointestinal: Soft and nontender. No distention. Musculoskeletal: No obvious deformities Neurologic:  Normal speech and language. No gross focal neurologic deficits are appreciated. Skin:  Skin is warm and dry. No rash noted. Psychiatric: Mood and affect are normal. Speech and behavior are normal.  ____________________________________________   LABS (all labs ordered are listed, but only abnormal results are displayed)  Labs Reviewed  COMPREHENSIVE METABOLIC PANEL - Abnormal; Notable for the following components:      Result Value   AST 43 (*)    ALT 71 (*)    Anion gap 4 (*)    All other components within normal limits  CBC - Abnormal; Notable for the following components:   RBC 4.18 (*)    Hemoglobin 11.6 (*)    HCT 35.5 (*)    RDW 15.7 (*)    All other components within normal limits  LIPASE, BLOOD  URINALYSIS, COMPLETE (UACMP) WITH MICROSCOPIC     RADIOLOGY  ED MD interpretation: CT of the abdomen and pelvis without contrast shows trace free fluid in the pelvis and is nonspecific as well as moderate colonic stool burden suggesting slow transit and constipation  Official radiology report(s): CT Abdomen Pelvis Wo Contrast  Result Date: 11/21/2020 CLINICAL DATA:  Awoke with left lower quadrant abdominal pain. Diverticulitis suspected. EXAM: CT ABDOMEN AND PELVIS WITHOUT CONTRAST TECHNIQUE: Multidetector CT imaging of the abdomen and pelvis was performed following the standard protocol without IV contrast. COMPARISON:  None. FINDINGS: Lower chest: No acute airspace disease or pleural effusion. Heart is normal in size. Hepatobiliary: No focal hepatic abnormality on unenhanced exam. Gallbladder is not well seen, likely decompressed. No biliary dilatation. Pancreas: Technically limited evaluation due to lack of contrast and paucity of intra-abdominal fat. Allowing for this, no ductal dilatation or inflammation. Spleen:  Normal in size without focal abnormality. Adrenals/Urinary Tract: No adrenal nodule. No hydronephrosis or renal calculi. No perinephric edema. Partially exophytic cyst arising from the lower left kidney, incompletely characterized on this unenhanced exam. Unremarkable urinary bladder. Stomach/Bowel: Bowel assessment is limited in the absence of contrast and paucity of intra-abdominal fat. The stomach is decompressed. There is no small bowel obstruction or inflammation. Mild fecalization of distal small bowel contents. Moderate stool burden throughout the colon. No significant diverticular disease. No visualized colonic wall thickening or pericolonic edema. The appendix is not confidently visualized. No evidence of appendicitis. Vascular/Lymphatic: Mild aortic atherosclerosis. No aortic aneurysm. No portal venous or mesenteric gas. No bulky abdominopelvic adenopathy, detailed assessment for abdominopelvic lymph nodes limited on this  unenhanced exam. Reproductive: Prostate is unremarkable. Other: Trace free fluid in the pelvis is nonspecific there is no free air. No abdominal wall hernia. Musculoskeletal: There are no acute or suspicious osseous abnormalities. IMPRESSION: 1. Trace free fluid in the pelvis is nonspecific in the setting. There is no evidence of abdominopelvic inflammation or acute findings. Detailed bowel assessment is limited in the absence of contrast and paucity of intra-abdominal fat. 2. Moderate colonic stool burden with fecalization of distal small bowel contents, suggesting slow transit. No bowel obstruction or inflammation. Aortic Atherosclerosis (ICD10-I70.0). Electronically Signed   By: Narda Rutherford M.D.   On: 11/21/2020 21:42    ____________________________________________   PROCEDURES  Procedure(s) performed (including Critical Care):  .1-3 Lead EKG Interpretation  Date/Time: 11/21/2020 11:24 PM Performed by: Merwyn Katos, MD Authorized by: Merwyn Katos, MD     Interpretation: normal     ECG rate:  71   ECG rate assessment: normal     Rhythm: sinus rhythm     Ectopy: none     Conduction: normal     ____________________________________________   INITIAL IMPRESSION / ASSESSMENT AND PLAN / ED COURSE  As part of my medical decision making, I reviewed the following data within the electronic medical record, if available:  Nursing notes reviewed and incorporated, Labs reviewed, EKG interpreted, Old chart reviewed, Radiograph reviewed and Notes from prior ED visits reviewed and incorporated        Patients history and exam most consistent with constipation as an etiology for their pain.  Patients symptoms not typical for other emergent causes of abdominal pain such as, but not limited to, appendicitis, abdominal aortic aneurysm, pancreatitis, SBO, mesenteric ischemia, serious intra-abdominal bacterial illness.  Patient without red flags concerning for cancer as a constipation  etiology.  Rx: Miralax  Disposition:  Patient will be discharged with strict return precautions and follow up with primary MD within 24-48 hours for further evaluation. Patient understands that this still may have an early presentation of an emergent medical condition such as appendicitis that will require a recheck.      ____________________________________________   FINAL CLINICAL IMPRESSION(S) / ED DIAGNOSES  Final diagnoses:  Left lower quadrant abdominal pain  Slow transit constipation     ED Discharge Orders          Ordered    polyethylene glycol powder (GLYCOLAX/MIRALAX) 17 GM/SCOOP powder  Daily        11/21/20 2229             Note:  This document was prepared using Dragon voice recognition software and may include unintentional dictation errors.    Merwyn Katos, MD 11/21/20 Janetta Hora    Merwyn Katos, MD 11/21/20 [phone]

## 2020-11-21 NOTE — ED Notes (Signed)
Pt ambulatory to RR at this time, unassisted accompanied with cane.

## 2020-11-21 NOTE — Discharge Instructions (Addendum)
Please use MiraLAX one half capful every hour for 4 hours tomorrow.  After that you may use as much as as needed for a well-formed bowel movement per day.

## 2020-11-21 NOTE — ED Notes (Signed)
Pt back from CT

## 2020-11-21 NOTE — ED Triage Notes (Addendum)
Pt to ER via [facility]- Pt reports waking up with LLQ stabbing abdominal pain. Denies NVD, reports last BM was hard. Reports poor fluid intake. No known hx of diverticulitis.   Ems VSS- 108/76, HR 80, 98%RA, cbg 115.   Pt received approx 250cc normal saline via Ems.

## 2020-11-22 ENCOUNTER — Other Ambulatory Visit: Payer: Self-pay

## 2020-11-22 ENCOUNTER — Emergency Department
Admission: EM | Admit: 2020-11-22 | Discharge: 2020-11-22 | Disposition: A | Discharge status: Home / Self Care | Payer: Medicaid Other | Attending: Emergency Medicine | Admitting: Emergency Medicine

## 2020-11-22 DIAGNOSIS — F606 Avoidant personality disorder: Secondary | ICD-10-CM | POA: Insufficient documentation

## 2020-11-22 DIAGNOSIS — Y900 Blood alcohol level of less than 20 mg/100 ml: Secondary | ICD-10-CM | POA: Insufficient documentation

## 2020-11-22 DIAGNOSIS — F17200 Nicotine dependence, unspecified, uncomplicated: Secondary | ICD-10-CM | POA: Diagnosis not present

## 2020-11-22 DIAGNOSIS — Z7901 Long term (current) use of anticoagulants: Secondary | ICD-10-CM | POA: Diagnosis not present

## 2020-11-22 DIAGNOSIS — Z20822 Contact with and (suspected) exposure to covid-19: Secondary | ICD-10-CM | POA: Diagnosis not present

## 2020-11-22 DIAGNOSIS — R45851 Suicidal ideations: Secondary | ICD-10-CM | POA: Diagnosis not present

## 2020-11-22 DIAGNOSIS — I2510 Atherosclerotic heart disease of native coronary artery without angina pectoris: Secondary | ICD-10-CM | POA: Insufficient documentation

## 2020-11-22 LAB — CBC
HCT: 39.1 % (ref 39.0–52.0)
Hemoglobin: 12.5 g/dL — ABNORMAL LOW (ref 13.0–17.0)
MCH: 28 pg (ref 26.0–34.0)
MCHC: 32 g/dL (ref 30.0–36.0)
MCV: 87.5 fL (ref 80.0–100.0)
Platelets: 187 10*3/uL (ref 150–400)
RBC: 4.47 MIL/uL (ref 4.22–5.81)
RDW: 15.6 % — ABNORMAL HIGH (ref 11.5–15.5)
WBC: 9.8 10*3/uL (ref 4.0–10.5)
nRBC: 0 % (ref 0.0–0.2)

## 2020-11-22 LAB — COMPREHENSIVE METABOLIC PANEL
ALT: 76 U/L — ABNORMAL HIGH (ref 0–44)
AST: 45 U/L — ABNORMAL HIGH (ref 15–41)
Albumin: 4.6 g/dL (ref 3.5–5.0)
Alkaline Phosphatase: 76 U/L (ref 38–126)
Anion gap: 7 (ref 5–15)
BUN: 15 mg/dL (ref 6–20)
CO2: 29 mmol/L (ref 22–32)
Calcium: 9.7 mg/dL (ref 8.9–10.3)
Chloride: 103 mmol/L (ref 98–111)
Creatinine, Ser: 0.74 mg/dL (ref 0.61–1.24)
GFR, Estimated: >60 mL/min (ref >60)
Glucose, Bld: 136 mg/dL — ABNORMAL HIGH (ref 70–99)
Potassium: 4 mmol/L (ref 3.5–5.1)
Sodium: 139 mmol/L (ref 135–145)
Total Bilirubin: 0.6 mg/dL (ref 0.3–1.2)
Total Protein: 8.1 g/dL (ref 6.5–8.1)

## 2020-11-22 LAB — ETHANOL: Alcohol, Ethyl (B): <10 mg/dL (ref <10)

## 2020-11-22 LAB — RESP PANEL BY RT-PCR (FLU A&B, COVID) ARPGX2
Influenza A by PCR: NEGATIVE
Influenza B by PCR: NEGATIVE
SARS Coronavirus 2 by RT PCR: NEGATIVE

## 2020-11-22 LAB — SALICYLATE LEVEL: Salicylate Lvl: <7 mg/dL — ABNORMAL LOW (ref 7.0–30.0)

## 2020-11-22 LAB — ACETAMINOPHEN LEVEL: Acetaminophen (Tylenol), Serum: <10 ug/mL — ABNORMAL LOW (ref 10–30)

## 2020-11-22 NOTE — ED Notes (Signed)
VOL  

## 2020-11-22 NOTE — BH Assessment (Addendum)
TTS called pt's group home [facility]: [phone]) and spoke directly with group home owner (Ms. Janey Greaser) who request that patient be transported by [facility] back to group home. Group home owner states she does not have staff available to transport at this time.

## 2020-11-22 NOTE — ED Notes (Signed)
The following items placed in one of one labeled bag: black pants, black belt, black sneakers, white socks, white t shirt, black lighter. Pt has own black cane.

## 2020-11-22 NOTE — ED Notes (Signed)
Pt up and ambulatory to bathroom with cane.

## 2020-11-22 NOTE — ED Notes (Signed)
Electronic signature pad not available. Verbal consent for discharge obtained from pt.

## 2020-11-22 NOTE — ED Notes (Signed)
This RN spoke with group home staff and manager (see note in chart for name and number) who states unable to transport patient tonight d/t only one staff member at group home.  Staff gave verbal permission to send patient in taxi.

## 2020-11-22 NOTE — ED Notes (Signed)
VOL/pending [facility] psych consult.

## 2020-11-22 NOTE — ED Provider Notes (Signed)
Patient evaluated by psych and has been cleared.  Patient stable appropriate for outpatient follow-up.   Willy Eddy, MD 11/22/20 1154

## 2020-11-22 NOTE — ED Triage Notes (Signed)
Pt states was waiting in the lobby for a ride home when he began to feel suicidal. Pt states he needs to talk to a doctor about "how i'm feeling". Pt states he lives at [facility] group home in [place].

## 2020-11-22 NOTE — ED Provider Notes (Signed)
[facility] Emergency Department Provider Note  ____________________________________________  Time seen: Approximately 5:39 AM  I have reviewed the triage vital signs and the nursing notes.   HISTORY  Chief Complaint Suicidal   HPI Patrick Marks is a 55 y.o. male for evaluation of suicidal thoughts.  Patient has a history of schizophrenia.  Was seen earlier today for abdominal pain and constipation.  Reports that he was discharged to the waiting room waiting for his group home to come and pick him up.  While sitting there, he became very anxious because he does not like his group home and does not want to go back.  He started having suicidal thoughts with a plan of walking outside and jump in front of a moving car.  So instead of doing that he decided to get checked back in to speak with a psychiatrist.  He continues to endorse suicidal thoughts.  He reports that as long as he is here he will feel safe and will not commit suicide.  However if we do discharge him to his group home he knows that his suicidal thoughts will return.   Past Medical History:  Diagnosis Date   Coronary artery disease    stent placement   CVA (cerebral infarction)    Seizures (HCC)     Patient Active Problem List   Diagnosis Date Noted   Seizures (HCC) 08/01/2019    Past Surgical History:  Procedure Laterality Date   CORONARY ANGIOPLASTY WITH STENT PLACEMENT     CORONARY ANGIOPLASTY WITH STENT PLACEMENT      Prior to Admission medications   Medication Sig Start Date End Date Taking? Authorizing Provider  albuterol (VENTOLIN HFA) 108 (90 Base) MCG/ACT inhaler Inhale 2 puffs into the lungs 4 (four) times daily as needed for wheezing or shortness of breath.    [provider]  baclofen (LIORESAL) 10 MG tablet Take 10 mg by mouth 3 (three) times daily.    [provider]  benztropine (COGENTIN) 0.5 MG tablet Take 0.5 mg by mouth every morning.    [provider]  cholecalciferol (VITAMIN D3) 25 MCG (1000 UNIT) tablet Take 1,000 Units by mouth in the morning.    [provider]  DULoxetine (CYMBALTA) 30 MG capsule Take 30 mg by mouth daily.    [provider]  fluPHENAZine decanoate (PROLIXIN) 25 MG/ML injection Inject 25 mg into the muscle every 28 (twenty-eight) days.    [provider]  loratadine (CLARITIN) 10 MG tablet Take 10 mg by mouth every morning.    [provider]  LORazepam (ATIVAN) 0.5 MG tablet Take 0.5 mg by mouth 3 (three) times daily.    [provider]  methocarbamol (ROBAXIN) 750 MG tablet Take 750 mg by mouth 3 (three) times daily as needed for muscle spasms.    [provider]  mirtazapine (REMERON) 15 MG tablet Take 15 mg by mouth at bedtime.    [provider]  pantoprazole (PROTONIX) 40 MG tablet Take 40 mg by mouth every morning.    [provider]  phenytoin (DILANTIN) 100 MG ER capsule Take 300 mg by mouth every morning.    [provider]  polyethylene glycol powder (GLYCOLAX/MIRALAX) 17 GM/SCOOP powder Take 17 g by mouth daily. 11/21/20   Merwyn Katos, MD  pyridOXINE (VITAMIN B-6) 50 MG tablet Take 50 mg by mouth every morning.    [provider]  rivaroxaban (XARELTO) 20 MG TABS tablet Take 20 mg  by mouth daily with supper.    [provider]  traZODone (DESYREL) 100 MG tablet Take 100 mg by mouth at bedtime.    [provider]    Allergies Penicillins  No family history on file.  Social History Social History   Tobacco Use   Smoking status: Every Day    Pack years: 0.00   Smokeless tobacco: Never   Tobacco comments:    9 cigs per day  Vaping Use   Vaping Use: Never used  Substance Use Topics   Alcohol use: Not Currently   Drug use: Not Currently    Review of Systems  Constitutional: Negative for fever. Eyes: Negative for visual changes. ENT: Negative for sore throat. Neck: No  neck pain  Cardiovascular: Negative for chest pain. Respiratory: Negative for shortness of breath. Gastrointestinal: Negative for abdominal pain, vomiting or diarrhea. Genitourinary: Negative for dysuria. Musculoskeletal: Negative for back pain. Skin: Negative for rash. Neurological: Negative for headaches, weakness or numbness. Psych: + SI. No HI  ____________________________________________   PHYSICAL EXAM:  VITAL SIGNS: ED Triage Vitals [11/22/20 0455]  Enc Vitals Group     BP 110/75     Pulse Rate 68     Resp 16     Temp      Temp Source Oral     SpO2 100 %     Weight 130 lb (59 kg)     Height 6\' 3"  (1.905 m)     Head Circumference      Peak Flow      Pain Score 0     Pain Loc      Pain Edu?      Excl. in GC?     Constitutional: Alert and oriented. Well appearing and in no apparent distress. HEENT:      Head: Normocephalic and atraumatic.         Eyes: Conjunctivae are normal. Sclera is non-icteric.       Mouth/Throat: Mucous membranes are moist.       Neck: Supple with no signs of meningismus. Cardiovascular: Regular rate and rhythm.  Respiratory: Normal respiratory effort.  Gastrointestinal: Soft, non tender, and non distended. Musculoskeletal: No edema, cyanosis, or erythema of extremities. Neurologic: Normal speech and language. Face is symmetric. Moving all extremities. No gross focal neurologic deficits are appreciated. Skin: Skin is warm, dry and intact. No rash noted. Psychiatric: Mood and affect are normal. Speech and behavior are normal.  ____________________________________________   LABS (all labs ordered are listed, but only abnormal results are displayed)  Labs Reviewed  CBC - Abnormal; Notable for the following components:      Result Value   Hemoglobin 12.5 (*)    RDW 15.6 (*)    All other components within normal limits  RESP PANEL BY RT-PCR (FLU A&B, COVID) ARPGX2  COMPREHENSIVE METABOLIC PANEL  ETHANOL  SALICYLATE LEVEL   ACETAMINOPHEN LEVEL  URINE DRUG SCREEN, QUALITATIVE ([facility] ONLY)   ____________________________________________  EKG  none  ____________________________________________  RADIOLOGY  none  ____________________________________________   PROCEDURES  Procedure(s) performed: None Procedures Critical Care performed:  None ____________________________________________   INITIAL IMPRESSION / ASSESSMENT AND PLAN / ED COURSE  55 y.o. male for evaluation of suicidal thoughts after being seen earlier in the evening for abdominal pain and discharge to triage to wait for his group home.  Patient reports that he does not want to go back to his group home.  He denies feeling suicidal while here in the emergency room where  he feels safe.  I did review his evaluation from earlier this evening including labs and imaging studies.  Patient has been studied with medically cleared and will consult psychiatry for further evaluation   The patient has been placed in psychiatric observation due to the need to provide a safe environment for the patient while obtaining psychiatric consultation and evaluation, as well as ongoing medical and medication management to treat the patient's condition.  The patient has not been placed under full IVC at this time.       Please note:  Patient was evaluated in Emergency Department today for the symptoms described in the history of present illness. Patient was evaluated in the context of the global COVID-19 pandemic, which necessitated consideration that the patient might be at risk for infection with the SARS-CoV-2 virus that causes COVID-19. Institutional protocols and algorithms that pertain to the evaluation of patients at risk for COVID-19 are in a state of rapid change based on information released by regulatory bodies including the CDC and federal and state organizations. These policies and algorithms were followed during the patient's care in the ED.  Some ED  evaluations and interventions may be delayed as a result of limited staffing during the pandemic.  ____________________________________________   FINAL CLINICAL IMPRESSION(S) / ED DIAGNOSES   Final diagnoses:  Suicidal thoughts      NEW MEDICATIONS STARTED DURING THIS VISIT:  ED Discharge Orders     None        Note:  This document was prepared using Dragon voice recognition software and may include unintentional dictation errors.     Don Perking, [place], MD 11/22/20 [id number]

## 2020-11-22 NOTE — BH Assessment (Signed)
Comprehensive Clinical Assessment (CCA) Note  11/22/2020 Patrick Marks [id number]  Chief Complaint: Patient is 55 year old male presenting to [facility] ED voluntarily due to SI. Per triage note Pt states was waiting in the lobby for a ride home when he began to feel suicidal. Pt states he needs to talk to a doctor about "how i'm feeling". Pt states he lives at [facility] group home in [place]. Patient was recently in this ED and was discharged on 11/21/20 around 11:30pm, patient initially came in for medical reasons as he was waiting for his ride back to his group home patient checked himself back in due to having SI. During assessment patient appears alert and oriented x4, calm and cooperative. Patient reports "I been here since 3pm now I'm having suicidal thoughts and I want to sign myself in to the hospital." When asked what triggered his suicidal thoughts and why he didn't feel them earlier he reports "I'm getting ready to get put out my group home." Patient reports that the reason for his thoughts "they are going to put me in another group home and I don't want to go." Patient reports that he is taking mental health medications as prescribed but is unsure of who the provider is. Patient continues to report SI with a plan "to step in front of a car." Patient denies HI/AH/VH and does not appear to be responding to any internal or external stimuli.   Psyc consult pending Chief Complaint  Patient presents with   Suicidal      CCA Screening, Triage and Referral (STR)  Patient Reported Information How did you hear about [place]? Self  Referral name: No data recorded Referral phone number: No data recorded  Whom do you see for routine medical problems? No data recorded Practice/Facility Name: No data recorded Practice/Facility Phone Number: No data recorded Name of Contact: No data recorded Contact Number: No data recorded Contact Fax Number: No data recorded Prescriber Name: No data  recorded Prescriber Address (if known): No data recorded  What Is the Reason for Your Visit/Call Today? Patient presenting voluntarily due to SI  How Long Has This Been Causing You Problems? 1-6 months  What Do You Feel Would Help You the Most Today? No data recorded  Have You Recently Been in Any Inpatient Treatment (Hospital/Detox/Crisis Center/28-Day Program)? No data recorded Name/Location of Program/Hospital:No data recorded How Long Were You There? No data recorded When Were You Discharged? No data recorded  Have You Ever Received Services From [facility] Before? No data recorded Who Do You See at [facility]? No data recorded  Have You Recently Had Any Thoughts About Hurting Yourself? Yes  Are You Planning to Commit Suicide/Harm Yourself At This time? Yes   Have you Recently Had Thoughts About Hurting Someone Karolee Ohs? No  Explanation: No data recorded  Have You Used Any Alcohol or Drugs in the Past 24 Hours? No  How Long Ago Did You Use Drugs or Alcohol? No data recorded What Did You Use and How Much? No data recorded  Do You Currently Have a Therapist/Psychiatrist? Yes  Name of Therapist/Psychiatrist: Unable to report who his Psychiatrist is   Have You Been Recently Discharged From Any Office Practice or Programs? No  Explanation of Discharge From Practice/Program: No data recorded    CCA Screening Triage Referral Assessment Type of Contact: Face-to-Face  Is this Initial or Reassessment? No data recorded Date Telepsych consult ordered in [facility]:  No data recorded Time Telepsych consult ordered in [facility]:  No data recorded  Patient Reported Information Reviewed? No data recorded Patient Left Without Being Seen? No data recorded Reason for Not Completing Assessment: No data recorded  Collateral Involvement: No data recorded  Does Patient Have a Court Appointed Legal Guardian? No data recorded Name and Contact of Legal Guardian: No data recorded If Minor and Not  Living with Parent(s), Who has Custody? No data recorded Is CPS involved or ever been involved? Never  Is APS involved or ever been involved? Never   Patient Determined To Be At Risk for Harm To Self or Others Based on Review of Patient Reported Information or Presenting Complaint? No  Method: No data recorded Availability of Means: No data recorded Intent: No data recorded Notification Required: No data recorded Additional Information for Danger to Others Potential: No data recorded Additional Comments for Danger to Others Potential: No data recorded Are There Guns or Other Weapons in Your Home? No data recorded Types of Guns/Weapons: No data recorded Are These Weapons Safely Secured?                            No data recorded Who Could Verify You Are Able To Have These Secured: No data recorded Do You Have any Outstanding Charges, Pending Court Dates, Parole/Probation? No data recorded Contacted To Inform of Risk of Harm To Self or Others: No data recorded  Location of Assessment: [facility] ED   Does Patient Present under Involuntary Commitment? No  IVC Papers Initial File Date: No data recorded  [place] of Residence: [place]   Patient Currently Receiving the Following Services: No data recorded  Determination of Need: Emergent (2 hours)   Options For Referral: No data recorded    CCA Biopsychosocial Intake/Chief Complaint:  NA  Current Symptoms/Problems: NA   Patient Reported Schizophrenia/Schizoaffective Diagnosis in Past: No   Strengths: Patient is able to communicate  Preferences: NA  Abilities: NA   Type of Services Patient Feels are Needed: NA   Initial Clinical Notes/Concerns: NA   Mental Health Symptoms Depression:   Hopelessness; Change in energy/activity   Duration of Depressive symptoms:  Greater than two weeks   Mania:   None   Anxiety:    None   Psychosis:   None   Duration of Psychotic symptoms: No data recorded  Trauma:    None   Obsessions:   None   Compulsions:   None   Inattention:   None   Hyperactivity/Impulsivity:   None   Oppositional/Defiant Behaviors:   None   Emotional Irregularity:   None   Other Mood/Personality Symptoms:  No data recorded   Mental Status Exam Appearance and self-care  Stature:   Average   Weight:   Average weight   Clothing:   Casual   Grooming:   Normal   Cosmetic use:   None   Posture/gait:   Normal   Motor activity:   Not Remarkable   Sensorium  Attention:   Normal   Concentration:   Normal   Orientation:   X5   Recall/memory:   Normal   Affect and Mood  Affect:   Appropriate   Mood:   Other (Comment)   Relating  Eye contact:   Normal   Facial expression:   Responsive   Attitude toward examiner:   Cooperative   Thought and Language  Speech flow:  Clear and Coherent   Thought content:   Appropriate to Mood and Circumstances   Preoccupation:  None   Hallucinations:   None   Organization:  No data recorded  [employer] of Knowledge:   Fair   Intelligence:   Average   Abstraction:   Normal   Judgement:   Fair   Programmer, systems   Insight:   Fair   Decision Making:   Normal   Social Functioning  Social Maturity:   Responsible   Social Judgement:   Normal   Stress  Stressors:   Housing   Coping Ability:   Normal   Skill Deficits:   None   Supports:   Friends/Service system     Religion: Religion/Spirituality Are You A Religious Person?: No  Leisure/Recreation: Leisure / Recreation Do You Have Hobbies?: No  Exercise/Diet: Exercise/Diet Do You Exercise?: No Have You Gained or Lost A Significant Amount of Weight in the Past Six Months?: No Do You Follow a Special Diet?: No Do You Have Any Trouble Sleeping?: No   CCA Employment/Education Employment/Work Situation: Employment / Work Systems developer: On disability Why is  Patient on Disability: Unknown How Long has Patient Been on Disability: Unknown Has Patient ever Been in the [employer]?: No  Education: Education Is Patient Currently Attending School?: No Did You Have An Individualized Education Program (IIEP): No Did You Have Any Difficulty At [employer]?: No Patient's Education Has Been Impacted by Current Illness: No   CCA Family/Childhood History Family and Relationship History: Family history Marital status: Single Does patient have children?: No  Childhood History:  Childhood History Did patient suffer any verbal/emotional/physical/sexual abuse as a child?: No Did patient suffer from severe childhood neglect?: No Has patient ever been sexually abused/assaulted/raped as an adolescent or adult?: No Was the patient ever a victim of a crime or a disaster?: No Witnessed domestic violence?: No Has patient been affected by domestic violence as an adult?: No  Child/Adolescent Assessment:     CCA Substance Use Alcohol/Drug Use: Alcohol / Drug Use Pain Medications: See MAR Prescriptions: See MAR Over the Counter: See MAR History of alcohol / drug use?: No history of alcohol / drug abuse                         ASAM's:  Six Dimensions of Multidimensional Assessment  Dimension 1:  Acute Intoxication and/or Withdrawal Potential:      Dimension 2:  Biomedical Conditions and Complications:      Dimension 3:  Emotional, Behavioral, or Cognitive Conditions and Complications:     Dimension 4:  Readiness to Change:     Dimension 5:  Relapse, Continued use, or Continued Problem Potential:     Dimension 6:  Recovery/Living Environment:     ASAM Severity Score:    ASAM Recommended Level of Treatment:     Substance use Disorder (SUD)    Recommendations for Services/Supports/Treatments:  Pending psyc consult  DSM5 Diagnoses: Patient Active Problem List   Diagnosis Date Noted   Seizures (HCC) 08/01/2019    Patient Centered  Plan: Patient is on the following Treatment Plan(s):  Anxiety   Referrals to Alternative Service(s): Referred to Alternative Service(s):   Place:   Date:   Time:    Referred to Alternative Service(s):   Place:   Date:   Time:    Referred to Alternative Service(s):   Place:   Date:   Time:    Referred to Alternative Service(s):   Place:   Date:   Time:  Ramsie Ostrander A Zaylen Susman, LCAS-A

## 2020-11-27 ENCOUNTER — Other Ambulatory Visit: Payer: Self-pay

## 2020-11-27 ENCOUNTER — Encounter: Payer: Self-pay | Admitting: Emergency Medicine

## 2020-11-27 ENCOUNTER — Emergency Department
Admission: EM | Admit: 2020-11-27 | Discharge: 2020-11-27 | Disposition: A | Discharge status: Home / Self Care | Payer: Medicaid Other | Attending: Student in an Organized Health Care Education/Training Program | Admitting: Student in an Organized Health Care Education/Training Program

## 2020-11-27 ENCOUNTER — Emergency Department: Payer: Medicaid Other

## 2020-11-27 DIAGNOSIS — I2510 Atherosclerotic heart disease of native coronary artery without angina pectoris: Secondary | ICD-10-CM | POA: Diagnosis not present

## 2020-11-27 DIAGNOSIS — Z955 Presence of coronary angioplasty implant and graft: Secondary | ICD-10-CM | POA: Diagnosis not present

## 2020-11-27 DIAGNOSIS — F17200 Nicotine dependence, unspecified, uncomplicated: Secondary | ICD-10-CM | POA: Diagnosis not present

## 2020-11-27 DIAGNOSIS — R0789 Other chest pain: Secondary | ICD-10-CM | POA: Diagnosis not present

## 2020-11-27 DIAGNOSIS — Z20822 Contact with and (suspected) exposure to covid-19: Secondary | ICD-10-CM | POA: Insufficient documentation

## 2020-11-27 LAB — TROPONIN I (HIGH SENSITIVITY)
Troponin I (High Sensitivity): 3 ng/L (ref <18)
Troponin I (High Sensitivity): 3 ng/L (ref <18)

## 2020-11-27 LAB — COMPREHENSIVE METABOLIC PANEL
ALT: 68 U/L — ABNORMAL HIGH (ref 0–44)
AST: 53 U/L — ABNORMAL HIGH (ref 15–41)
Albumin: 4.2 g/dL (ref 3.5–5.0)
Alkaline Phosphatase: 72 U/L (ref 38–126)
Anion gap: 7 (ref 5–15)
BUN: 21 mg/dL — ABNORMAL HIGH (ref 6–20)
CO2: 28 mmol/L (ref 22–32)
Calcium: 9.5 mg/dL (ref 8.9–10.3)
Chloride: 103 mmol/L (ref 98–111)
Creatinine, Ser: 0.77 mg/dL (ref 0.61–1.24)
GFR, Estimated: >60 mL/min (ref >60)
Glucose, Bld: 123 mg/dL — ABNORMAL HIGH (ref 70–99)
Potassium: 4.3 mmol/L (ref 3.5–5.1)
Sodium: 138 mmol/L (ref 135–145)
Total Bilirubin: 0.6 mg/dL (ref 0.3–1.2)
Total Protein: 7.5 g/dL (ref 6.5–8.1)

## 2020-11-27 LAB — CBC
HCT: 37.1 % — ABNORMAL LOW (ref 39.0–52.0)
Hemoglobin: 12 g/dL — ABNORMAL LOW (ref 13.0–17.0)
MCH: 28 pg (ref 26.0–34.0)
MCHC: 32.3 g/dL (ref 30.0–36.0)
MCV: 86.7 fL (ref 80.0–100.0)
Platelets: 184 10*3/uL (ref 150–400)
RBC: 4.28 MIL/uL (ref 4.22–5.81)
RDW: 15.4 % (ref 11.5–15.5)
WBC: 6 10*3/uL (ref 4.0–10.5)
nRBC: 0 % (ref 0.0–0.2)

## 2020-11-27 LAB — RESP PANEL BY RT-PCR (FLU A&B, COVID) ARPGX2
Influenza A by PCR: NEGATIVE
Influenza B by PCR: NEGATIVE
SARS Coronavirus 2 by RT PCR: NEGATIVE

## 2020-11-27 MED ORDER — SODIUM CHLORIDE 0.9 % IV BOLUS
500.0000 mL | Freq: Once | INTRAVENOUS | Status: AC
  Administered 2020-11-27: 500 mL via INTRAVENOUS

## 2020-11-27 MED ORDER — LIDOCAINE VISCOUS HCL 2 % MT SOLN
15.0000 mL | Freq: Once | OROMUCOSAL | Status: AC
  Administered 2020-11-27: 15 mL via ORAL
  Filled 2020-11-27: qty 15

## 2020-11-27 MED ORDER — ALUM & MAG HYDROXIDE-SIMETH 200-200-20 MG/5ML PO SUSP
30.0000 mL | Freq: Once | ORAL | Status: AC
  Administered 2020-11-27: 30 mL via ORAL
  Filled 2020-11-27: qty 30

## 2020-11-27 NOTE — ED Notes (Signed)
Patrick Marks called at this time to take patient back to group home.

## 2020-11-27 NOTE — ED Notes (Signed)
Attempted to call [facility] for report and ride- number has been discontinued. Attempted to call patient's mother with no answer. Charge RN made aware.

## 2020-11-27 NOTE — ED Triage Notes (Signed)
Patient arrives via [facility] form group home for CP. Patient states it's intermittent and been going on for "a while" and got worse today.

## 2020-11-27 NOTE — ED Provider Notes (Signed)
[facility] Emergency Department Provider Note    Event Date/Time   First MD Initiated Contact with Patient 11/27/20 1153     (approximate)  I have reviewed the triage vital signs and the nursing notes.   HISTORY  Chief Complaint Chest Pain    HPI Patrick Marks is a 55 y.o. male with below listed past medical history presents to the ER for evaluation of intermittent chest discomfort for several weeks he says.  Became more severe today.  No associated diaphoresis.  No nausea or vomiting.  He denies any radiation of discomfort.  No recent fevers.  No new medications.  Past Medical History:  Diagnosis Date   Coronary artery disease    stent placement   CVA (cerebral infarction)    Seizures (HCC)    No family history on file. Past Surgical History:  Procedure Laterality Date   CORONARY ANGIOPLASTY WITH STENT PLACEMENT     CORONARY ANGIOPLASTY WITH STENT PLACEMENT     Patient Active Problem List   Diagnosis Date Noted   Seizures (HCC) 08/01/2019      Prior to Admission medications   Medication Sig Start Date End Date Taking? Authorizing Provider  albuterol (VENTOLIN HFA) 108 (90 Base) MCG/ACT inhaler Inhale 2 puffs into the lungs 4 (four) times daily as needed for wheezing or shortness of breath.    [provider]  baclofen (LIORESAL) 10 MG tablet Take 10 mg by mouth 3 (three) times daily.    [provider]  benztropine (COGENTIN) 0.5 MG tablet Take 0.5 mg by mouth every morning.    [provider]  cholecalciferol (VITAMIN D3) 25 MCG (1000 UNIT) tablet Take 1,000 Units by mouth in the morning.    [provider]  DULoxetine (CYMBALTA) 30 MG capsule Take 30 mg by mouth daily.    [provider]  fluPHENAZine decanoate (PROLIXIN) 25 MG/ML injection Inject 25 mg into the muscle every 28 (twenty-eight) days.    [provider]  loratadine (CLARITIN) 10 MG tablet Take 10 mg by mouth every morning.     [provider]  LORazepam (ATIVAN) 0.5 MG tablet Take 0.5 mg by mouth 3 (three) times daily.    [provider]  methocarbamol (ROBAXIN) 750 MG tablet Take 750 mg by mouth 3 (three) times daily as needed for muscle spasms.    [provider]  mirtazapine (REMERON) 15 MG tablet Take 15 mg by mouth at bedtime.    [provider]  pantoprazole (PROTONIX) 40 MG tablet Take 40 mg by mouth every morning.    [provider]  phenytoin (DILANTIN) 100 MG ER capsule Take 300 mg by mouth every morning.    [provider]  polyethylene glycol powder (GLYCOLAX/MIRALAX) 17 GM/SCOOP powder Take 17 g by mouth daily. 11/21/20   Merwyn Katos, MD  pyridOXINE (VITAMIN B-6) 50 MG tablet Take 50 mg by mouth every morning.    [provider]  rivaroxaban (XARELTO) 20 MG TABS tablet Take 20 mg by mouth daily with supper.    [provider]  traZODone (DESYREL) 100 MG tablet Take 100 mg by mouth at bedtime.    [provider]    Allergies Penicillins    Social History Social History   Tobacco Use   Smoking status: Every Day    Pack years: 0.00   Smokeless tobacco: Never   Tobacco comments:    9 cigs per day  Vaping Use   Vaping Use:  Never used  Substance Use Topics   Alcohol use: Not Currently   Drug use: Not Currently    Review of Systems Patient denies headaches, rhinorrhea, blurry vision, numbness, shortness of breath, chest pain, edema, cough, abdominal pain, nausea, vomiting, diarrhea, dysuria, fevers, rashes or hallucinations unless otherwise stated above in HPI. ____________________________________________   PHYSICAL EXAM:  VITAL SIGNS: Vitals:   11/27/20 1330 11/27/20 1430  BP: 112/78 116/74  Pulse: 66 60  Resp: 18 16  Temp:    SpO2: 100% 100%    Constitutional: Alert, calm and in NAD Eyes: Conjunctivae are normal.  Head: Atraumatic. Nose: No congestion/rhinnorhea. Mouth/Throat: Mucous membranes  are moist.   Neck: No stridor. Painless ROM.  Cardiovascular: Normal rate, regular rhythm. Grossly normal heart sounds.  Good peripheral circulation. Respiratory: Normal respiratory effort.  No retractions. Lungs CTAB. Gastrointestinal: Soft and nontender. No distention. No abdominal bruits. No CVA tenderness. Genitourinary:  Musculoskeletal: No lower extremity tenderness nor edema.  No joint effusions. Neurologic:   No gross focal neurologic deficits are appreciated.  Skin:  Skin is warm, dry and intact. No rash noted. Psychiatric: calm and cooperative  ____________________________________________   LABS (all labs ordered are listed, but only abnormal results are displayed)  Results for orders placed or performed during the hospital encounter of 11/27/20 (from the past 24 hour(s))  CBC     Status: Abnormal   Collection Time: 11/27/20 12:00 PM  Result Value Ref Range   WBC 6.0 4.0 - 10.5 K/uL   RBC 4.28 4.22 - 5.81 MIL/uL   Hemoglobin 12.0 (L) 13.0 - 17.0 g/dL   HCT 96.2 (L) 95.2 - 84.1 %   MCV 86.7 80.0 - 100.0 fL   MCH 28.0 26.0 - 34.0 pg   MCHC 32.3 30.0 - 36.0 g/dL   RDW 32.4 40.1 - 02.7 %   Platelets 184 150 - 400 K/uL   nRBC 0.0 0.0 - 0.2 %  Comprehensive metabolic panel     Status: Abnormal   Collection Time: 11/27/20 12:00 PM  Result Value Ref Range   Sodium 138 135 - 145 mmol/L   Potassium 4.3 3.5 - 5.1 mmol/L   Chloride 103 98 - 111 mmol/L   CO2 28 22 - 32 mmol/L   Glucose, Bld 123 (H) 70 - 99 mg/dL   BUN 21 (H) 6 - 20 mg/dL   Creatinine, Ser 2.53 0.61 - 1.24 mg/dL   Calcium 9.5 8.9 - 66.4 mg/dL   Total Protein 7.5 6.5 - 8.1 g/dL   Albumin 4.2 3.5 - 5.0 g/dL   AST 53 (H) 15 - 41 U/L   ALT 68 (H) 0 - 44 U/L   Alkaline Phosphatase 72 38 - 126 U/L   Total Bilirubin 0.6 0.3 - 1.2 mg/dL   GFR, Estimated >40 >34 mL/min   Anion gap 7 5 - 15  Troponin I (High Sensitivity)     Status: None   Collection Time: 11/27/20 12:00 PM  Result Value Ref Range   Troponin I  (High Sensitivity) 3 <18 ng/L  Resp Panel by RT-PCR (Flu A&B, Covid) Nasopharyngeal Swab     Status: None   Collection Time: 11/27/20  1:18 PM   Specimen: Nasopharyngeal Swab; Nasopharyngeal(NP) swabs in vial transport medium  Result Value Ref Range   SARS Coronavirus 2 by RT PCR NEGATIVE NEGATIVE   Influenza A by PCR NEGATIVE NEGATIVE   Influenza B by PCR NEGATIVE NEGATIVE   ____________________________________________  EKG My review and personal interpretation at Time:  11:49   Indication: chest pain  Rate: 100  Rhythm: sinus Axis: normal Other: normal intervals, no stemi, no depressions ____________________________________________  RADIOLOGY  I personally reviewed all radiographic images ordered to evaluate for the above acute complaints and reviewed radiology reports and findings.  These findings were personally discussed with the patient.  Please see medical record for radiology report.  ____________________________________________   PROCEDURES  Procedure(s) performed:  Procedures    Critical Care performed: no ____________________________________________   INITIAL IMPRESSION / ASSESSMENT AND PLAN / ED COURSE  Pertinent labs & imaging results that were available during my care of the patient were reviewed by me and considered in my medical decision making (see chart for details).   DDX: ACS, pericarditis, esophagitis, boerhaaves, pe, dissection, pna, bronchitis, costochondritis   Patrick Marks is a 55 y.o. who presents to the ED with presentation as described above.  Patient nontoxic-appearing in no acute distress.  No respiratory symptoms suggest pulmonary etiology.  Not consistent with PE lowers by Wells no tachycardia no hypoxia.  His EKG does not show any evidence of ST changes or acute ischemia.  Initial troponin negative.  Blood work overall is reassuring.  Abdominal exam soft and benign.  Patient given GI cocktail with improvement in symptoms.  Will observe in the  ER for repeat troponin at this is negative and he remains pain-free I think will be appropriate for outpatient follow-up.     The patient was evaluated in Emergency Department today for the symptoms described in the history of present illness. He/she was evaluated in the context of the global COVID-19 pandemic, which necessitated consideration that the patient might be at risk for infection with the SARS-CoV-2 virus that causes COVID-19. Institutional protocols and algorithms that pertain to the evaluation of patients at risk for COVID-19 are in a state of rapid change based on information released by regulatory bodies including the CDC and federal and state organizations. These policies and algorithms were followed during the patient's care in the ED.  As part of my medical decision making, I reviewed the following data within the electronic MEDICAL RECORD NUMBER Nursing notes reviewed and incorporated, Labs reviewed, notes from prior ED visits and [place] Controlled Substance Database   ____________________________________________   FINAL CLINICAL IMPRESSION(S) / ED DIAGNOSES  Final diagnoses:  Atypical chest pain      NEW MEDICATIONS STARTED DURING THIS VISIT:  New Prescriptions   No medications on file     Note:  This document was prepared using Dragon voice recognition software and may include unintentional dictation errors.    Willy Eddy, MD 11/27/20 [phone]

## 2020-11-28 ENCOUNTER — Encounter: Payer: Self-pay | Admitting: Internal Medicine

## 2020-12-11 ENCOUNTER — Encounter (HOSPITAL_COMMUNITY): Payer: Self-pay

## 2020-12-11 ENCOUNTER — Emergency Department (HOSPITAL_COMMUNITY)
Admission: EM | Admit: 2020-12-11 | Discharge: 2020-12-12 | Disposition: A | Discharge status: Home / Self Care | Payer: Medicaid Other | Source: Home / Self Care | Attending: Emergency Medicine | Admitting: Emergency Medicine

## 2020-12-11 ENCOUNTER — Emergency Department (HOSPITAL_COMMUNITY)
Admission: EM | Admit: 2020-12-11 | Discharge: 2020-12-11 | Disposition: A | Discharge status: Home / Self Care | Payer: Medicaid Other | Attending: Emergency Medicine | Admitting: Emergency Medicine

## 2020-12-11 ENCOUNTER — Other Ambulatory Visit: Payer: Self-pay

## 2020-12-11 ENCOUNTER — Encounter (HOSPITAL_COMMUNITY): Payer: Self-pay | Admitting: Emergency Medicine

## 2020-12-11 DIAGNOSIS — I2510 Atherosclerotic heart disease of native coronary artery without angina pectoris: Secondary | ICD-10-CM | POA: Insufficient documentation

## 2020-12-11 DIAGNOSIS — Z79899 Other long term (current) drug therapy: Secondary | ICD-10-CM | POA: Insufficient documentation

## 2020-12-11 DIAGNOSIS — Z7901 Long term (current) use of anticoagulants: Secondary | ICD-10-CM | POA: Insufficient documentation

## 2020-12-11 DIAGNOSIS — R109 Unspecified abdominal pain: Secondary | ICD-10-CM | POA: Insufficient documentation

## 2020-12-11 DIAGNOSIS — F17200 Nicotine dependence, unspecified, uncomplicated: Secondary | ICD-10-CM | POA: Insufficient documentation

## 2020-12-11 DIAGNOSIS — D649 Anemia, unspecified: Secondary | ICD-10-CM | POA: Insufficient documentation

## 2020-12-11 DIAGNOSIS — Z5321 Procedure and treatment not carried out due to patient leaving prior to being seen by health care provider: Secondary | ICD-10-CM | POA: Diagnosis not present

## 2020-12-11 LAB — LIPASE, BLOOD: Lipase: 35 U/L (ref 11–51)

## 2020-12-11 LAB — CBC WITH DIFFERENTIAL/PLATELET
Abs Immature Granulocytes: 0.02 10*3/uL (ref 0.00–0.07)
Basophils Absolute: 0.1 10*3/uL (ref 0.0–0.1)
Basophils Relative: 1 %
Eosinophils Absolute: 0.2 10*3/uL (ref 0.0–0.5)
Eosinophils Relative: 2 %
HCT: 37.6 % — ABNORMAL LOW (ref 39.0–52.0)
Hemoglobin: 11.7 g/dL — ABNORMAL LOW (ref 13.0–17.0)
Immature Granulocytes: 0 %
Lymphocytes Relative: 27 %
Lymphs Abs: 2.1 10*3/uL (ref 0.7–4.0)
MCH: 27.3 pg (ref 26.0–34.0)
MCHC: 31.1 g/dL (ref 30.0–36.0)
MCV: 87.9 fL (ref 80.0–100.0)
Monocytes Absolute: 0.6 10*3/uL (ref 0.1–1.0)
Monocytes Relative: 7 %
Neutro Abs: 5 10*3/uL (ref 1.7–7.7)
Neutrophils Relative %: 63 %
Platelets: 211 10*3/uL (ref 150–400)
RBC: 4.28 MIL/uL (ref 4.22–5.81)
RDW: 15.4 % (ref 11.5–15.5)
WBC: 7.9 10*3/uL (ref 4.0–10.5)
nRBC: 0 % (ref 0.0–0.2)

## 2020-12-11 LAB — COMPREHENSIVE METABOLIC PANEL
ALT: 48 U/L — ABNORMAL HIGH (ref 0–44)
AST: 36 U/L (ref 15–41)
Albumin: 3.7 g/dL (ref 3.5–5.0)
Alkaline Phosphatase: 73 U/L (ref 38–126)
Anion gap: 7 (ref 5–15)
BUN: 20 mg/dL (ref 6–20)
CO2: 27 mmol/L (ref 22–32)
Calcium: 9.4 mg/dL (ref 8.9–10.3)
Chloride: 103 mmol/L (ref 98–111)
Creatinine, Ser: 0.83 mg/dL (ref 0.61–1.24)
GFR, Estimated: >60 mL/min (ref >60)
Glucose, Bld: 101 mg/dL — ABNORMAL HIGH (ref 70–99)
Potassium: 4.2 mmol/L (ref 3.5–5.1)
Sodium: 137 mmol/L (ref 135–145)
Total Bilirubin: 0.4 mg/dL (ref 0.3–1.2)
Total Protein: 6.9 g/dL (ref 6.5–8.1)

## 2020-12-11 LAB — URINALYSIS, ROUTINE W REFLEX MICROSCOPIC
Bilirubin Urine: NEGATIVE
Glucose, UA: NEGATIVE mg/dL
Hgb urine dipstick: NEGATIVE
Ketones, ur: NEGATIVE mg/dL
Leukocytes,Ua: NEGATIVE
Nitrite: NEGATIVE
Protein, ur: NEGATIVE mg/dL
Specific Gravity, Urine: 1.02 (ref 1.005–1.030)
pH: 6 (ref 5.0–8.0)

## 2020-12-11 LAB — TROPONIN I (HIGH SENSITIVITY): Troponin I (High Sensitivity): 3 ng/L (ref <18)

## 2020-12-11 NOTE — ED Provider Notes (Signed)
Emergency Medicine Provider Triage Evaluation Note  Wael Maestas , a 55 y.o. male  was evaluated in triage.  Pt complains of presents with left-sided pain, started around 6 PM today, does not radiate, does not have shortness of breath, episodic sweating, no cardiac history, has had this pain in the past..  Review of Systems  Positive: Left rib pain Negative: Shortness of breath, leg swelling  Physical Exam  There were no vitals taken for this visit. Gen:   Awake, no distress   Resp:  Normal effort  MSK:   Moves extremities without difficulty  Other:    Medical Decision Making  Medically screening exam initiated at 7:12 PM.  Appropriate orders placed.  Kadyn Roes was informed that the remainder of the evaluation will be completed by another provider, this initial triage assessment does not replace that evaluation, and the importance of remaining in the ED until their evaluation is complete.  Presents with chest pain, lab work and imaging have been ordered, patient will need further work-up.   Carroll Sage, PA-C 12/11/20 Angelene Giovanni    Bethann Berkshire, MD 12/15/20 1041

## 2020-12-11 NOTE — ED Notes (Signed)
Pt LWBS 

## 2020-12-11 NOTE — ED Triage Notes (Signed)
Pt brought in by EMS for left sided abd pain for 3 weeks.

## 2020-12-11 NOTE — ED Triage Notes (Signed)
Patient here with abdominal pain.  No nausea or vomiting Patient left ED shortly and came back, see labs from earlier in evening.  Patient brought in by EMS.

## 2020-12-12 NOTE — Discharge Instructions (Addendum)
No definite reason for your flank pain was found today.  Your labs and urinalysis appear to be normal.  Please return if you have return of your symptoms, or new symptoms that need to be evaluated.  Otherwise, please follow-up with your primary care doctor next week

## 2020-12-12 NOTE — ED Notes (Signed)
Pt stepped outside for air.

## 2020-12-12 NOTE — ED Provider Notes (Signed)
[facility] EMERGENCY DEPARTMENT Provider Note   CSN: [id number] Arrival date & time: 12/11/20  2143     History Chief Complaint  Patient presents with   Abdominal Pain    Patrick Marks is a 55 y.o. male.  HPI 55 year old male with chief complaint of left flank pain that occurred twice yesterday and lasted about 10 minutes each.  He describes it as sharp and stabbing to the left side of his abdomen.  He did not have any associated symptoms.  He denies any fever, chills, nausea, vomiting, chest pain or cough.  He reports having his COVID vaccines and boosters.  He states that this happened 1 time in the past and no etiology was found.  He denies any hematuria but does endorse some occasional dysuria.     Past Medical History:  Diagnosis Date   Coronary artery disease    stent placement   CVA (cerebral infarction)    Seizures (HCC)     Patient Active Problem List   Diagnosis Date Noted   Seizures (HCC) 08/01/2019    Past Surgical History:  Procedure Laterality Date   CORONARY ANGIOPLASTY WITH STENT PLACEMENT     CORONARY ANGIOPLASTY WITH STENT PLACEMENT         No family history on file.  Social History   Tobacco Use   Smoking status: Every Day   Smokeless tobacco: Never   Tobacco comments:    9 cigs per day  Vaping Use   Vaping Use: Never used  Substance Use Topics   Alcohol use: Not Currently   Drug use: Not Currently    Home Medications Prior to Admission medications   Medication Sig Start Date End Date Taking? Authorizing Provider  albuterol (VENTOLIN HFA) 108 (90 Base) MCG/ACT inhaler Inhale 2 puffs into the lungs 4 (four) times daily as needed for wheezing or shortness of breath.    [provider]  baclofen (LIORESAL) 10 MG tablet Take 10 mg by mouth 3 (three) times daily.    [provider]  benztropine (COGENTIN) 0.5 MG tablet Take 0.5 mg by mouth every morning.    [provider]  cholecalciferol  (VITAMIN D3) 25 MCG (1000 UNIT) tablet Take 1,000 Units by mouth in the morning.    [provider]  DULoxetine (CYMBALTA) 30 MG capsule Take 30 mg by mouth daily.    [provider]  fluPHENAZine decanoate (PROLIXIN) 25 MG/ML injection Inject 25 mg into the muscle every 28 (twenty-eight) days.    [provider]  loratadine (CLARITIN) 10 MG tablet Take 10 mg by mouth every morning.    [provider]  LORazepam (ATIVAN) 0.5 MG tablet Take 0.5 mg by mouth 3 (three) times daily.    [provider]  methocarbamol (ROBAXIN) 750 MG tablet Take 750 mg by mouth 3 (three) times daily as needed for muscle spasms.    [provider]  mirtazapine (REMERON) 15 MG tablet Take 15 mg by mouth at bedtime.    [provider]  pantoprazole (PROTONIX) 40 MG tablet Take 40 mg by mouth every morning.    [provider]  phenytoin (DILANTIN) 100 MG ER capsule Take 300 mg by mouth every morning.    [provider]  polyethylene glycol powder (GLYCOLAX/MIRALAX) 17 GM/SCOOP powder Take 17 g by mouth daily. 11/21/20   Merwyn Katos, MD  pyridOXINE (VITAMIN B-6) 50 MG tablet Take 50 mg by mouth every morning.    [provider]  rivaroxaban (XARELTO) 20 MG TABS tablet Take 20 mg by mouth daily with supper.    [provider]  traZODone (DESYREL) 100 MG tablet Take 100 mg by mouth at bedtime.    [provider]    Allergies    Penicillins  Review of Systems   Review of Systems  All other systems reviewed and are negative.  Physical Exam Updated Vital Signs BP 131/80   Pulse 75   Temp 98.6 F (37 C) (Oral)   Resp 15   SpO2 98%   Physical Exam Vitals and nursing note reviewed.  Constitutional:      Appearance: He is well-developed.  HENT:     Head: Normocephalic and atraumatic.     Right Ear: External ear normal.     Left Ear: External ear normal.     Nose: Nose normal.  Eyes:      Conjunctiva/sclera: Conjunctivae normal.     Pupils: Pupils are equal, round, and reactive to light.  Cardiovascular:     Rate and Rhythm: Normal rate and regular rhythm.     Heart sounds: Normal heart sounds.  Pulmonary:     Effort: Pulmonary effort is normal. No respiratory distress.     Breath sounds: Normal breath sounds. No wheezing.  Chest:     Chest wall: No tenderness.  Abdominal:     General: Bowel sounds are normal. There is no distension.     Palpations: Abdomen is soft. There is no mass.     Tenderness: There is no abdominal tenderness. There is no guarding.  Musculoskeletal:        General: Normal range of motion.     Cervical back: Normal range of motion and neck supple.  Skin:    General: Skin is warm and dry.  Neurological:     Mental Status: He is alert and oriented to person, place, and time.     Motor: No abnormal muscle tone.     Coordination: Coordination normal.     Deep Tendon Reflexes: Reflexes are normal and symmetric.  Psychiatric:        Behavior: Behavior normal.        Thought Content: Thought content normal.        Judgment: Judgment normal.    ED Results / Procedures / Treatments   Labs (all labs ordered are listed, but only abnormal results are displayed) Labs Reviewed - No data to display  EKG None  Radiology No results found.  Procedures Procedures   Medications Ordered in ED Medications - No data to display  ED Course  I have reviewed the triage vital signs and the nursing notes.  Pertinent labs & imaging results that were available during my care of the patient were reviewed by me and considered in my medical decision making (see chart for details).    MDM Rules/Calculators/A&P                         55 year old male presents today complaining of 2 episodes of left flank pain yesterday.  The symptoms have resolved and patient has not had return of symptoms.  He was evaluated here with urinalysis and lab which were all normal  with the exception of slightly elevated ALT at 48 and mild anemia with a hemoglobin 11.7 which is at baseline for patient.  Urine is clear.  Unclear etiology of intermittent left flank pain but as it has resolved and patient has normal vital signs, lab  work-up here, patient appears stable for discharge.   Final Clinical Impression(s) / ED Diagnoses Final diagnoses:  Left flank pain    Rx / DC Orders ED Discharge Orders     None        Margarita Grizzle, MD 12/21/20 [phone]

## 2020-12-12 NOTE — ED Notes (Signed)
Pt has returned to waiting room.
